# Patient Record
Sex: Female | Born: 1960 | Race: White | Hispanic: No | Smoking: Current every day smoker
Health system: Southern US, Community
[De-identification: ages and names within clinical notes are randomized; demographics above are authoritative.]

## PROBLEM LIST (undated history)

## (undated) DIAGNOSIS — R569 Unspecified convulsions: Secondary | ICD-10-CM

## (undated) HISTORY — PX: ABDOMINAL HYSTERECTOMY: SHX81

---

## 2012-12-20 ENCOUNTER — Emergency Department (HOSPITAL_COMMUNITY)
Admission: EM | Admit: 2012-12-20 | Discharge: 2012-12-20 | Disposition: A | Discharge status: Home / Self Care | Payer: BC Managed Care – PPO | Attending: Emergency Medicine | Admitting: Emergency Medicine

## 2012-12-20 ENCOUNTER — Emergency Department (HOSPITAL_COMMUNITY): Payer: BC Managed Care – PPO

## 2012-12-20 ENCOUNTER — Encounter (HOSPITAL_COMMUNITY): Payer: Self-pay | Admitting: Emergency Medicine

## 2012-12-20 DIAGNOSIS — W1809XA Striking against other object with subsequent fall, initial encounter: Secondary | ICD-10-CM | POA: Insufficient documentation

## 2012-12-20 DIAGNOSIS — S20212A Contusion of left front wall of thorax, initial encounter: Secondary | ICD-10-CM

## 2012-12-20 DIAGNOSIS — R5381 Other malaise: Secondary | ICD-10-CM | POA: Insufficient documentation

## 2012-12-20 DIAGNOSIS — R197 Diarrhea, unspecified: Secondary | ICD-10-CM | POA: Insufficient documentation

## 2012-12-20 DIAGNOSIS — K529 Noninfective gastroenteritis and colitis, unspecified: Secondary | ICD-10-CM

## 2012-12-20 DIAGNOSIS — Z79899 Other long term (current) drug therapy: Secondary | ICD-10-CM | POA: Insufficient documentation

## 2012-12-20 DIAGNOSIS — Y9289 Other specified places as the place of occurrence of the external cause: Secondary | ICD-10-CM | POA: Insufficient documentation

## 2012-12-20 DIAGNOSIS — S0990XA Unspecified injury of head, initial encounter: Secondary | ICD-10-CM | POA: Insufficient documentation

## 2012-12-20 DIAGNOSIS — S20219A Contusion of unspecified front wall of thorax, initial encounter: Secondary | ICD-10-CM | POA: Insufficient documentation

## 2012-12-20 DIAGNOSIS — Z9889 Other specified postprocedural states: Secondary | ICD-10-CM | POA: Insufficient documentation

## 2012-12-20 DIAGNOSIS — R112 Nausea with vomiting, unspecified: Secondary | ICD-10-CM | POA: Insufficient documentation

## 2012-12-20 DIAGNOSIS — Z90710 Acquired absence of both cervix and uterus: Secondary | ICD-10-CM | POA: Insufficient documentation

## 2012-12-20 DIAGNOSIS — G40909 Epilepsy, unspecified, not intractable, without status epilepticus: Secondary | ICD-10-CM | POA: Insufficient documentation

## 2012-12-20 DIAGNOSIS — W010XXA Fall on same level from slipping, tripping and stumbling without subsequent striking against object, initial encounter: Secondary | ICD-10-CM | POA: Insufficient documentation

## 2012-12-20 DIAGNOSIS — F17200 Nicotine dependence, unspecified, uncomplicated: Secondary | ICD-10-CM | POA: Insufficient documentation

## 2012-12-20 DIAGNOSIS — Y93E1 Activity, personal bathing and showering: Secondary | ICD-10-CM | POA: Insufficient documentation

## 2012-12-20 DIAGNOSIS — K5289 Other specified noninfective gastroenteritis and colitis: Secondary | ICD-10-CM | POA: Insufficient documentation

## 2012-12-20 HISTORY — DX: Unspecified convulsions: R56.9

## 2012-12-20 LAB — CBC WITH DIFFERENTIAL/PLATELET
Basophils Relative: 0 % (ref 0–1)
HCT: 45 % (ref 36.0–46.0)
Hemoglobin: 15.2 g/dL — ABNORMAL HIGH (ref 12.0–15.0)
Lymphocytes Relative: 4 % — ABNORMAL LOW (ref 12–46)
Lymphs Abs: 0.5 10*3/uL — ABNORMAL LOW (ref 0.7–4.0)
Monocytes Absolute: 0.6 10*3/uL (ref 0.1–1.0)
Monocytes Relative: 4 % (ref 3–12)
Neutro Abs: 13.4 10*3/uL — ABNORMAL HIGH (ref 1.7–7.7)
Neutrophils Relative %: 92 % — ABNORMAL HIGH (ref 43–77)
RBC: 5.05 MIL/uL (ref 3.87–5.11)
WBC: 14.6 10*3/uL — ABNORMAL HIGH (ref 4.0–10.5)

## 2012-12-20 LAB — BASIC METABOLIC PANEL
BUN: 13 mg/dL (ref 6–23)
CO2: 27 mEq/L (ref 19–32)
Chloride: 98 mEq/L (ref 96–112)
Creatinine, Ser: 0.63 mg/dL (ref 0.50–1.10)
GFR calc Af Amer: >90 mL/min (ref >90)
Potassium: 4.1 mEq/L (ref 3.5–5.1)

## 2012-12-20 LAB — URINALYSIS, ROUTINE W REFLEX MICROSCOPIC
Glucose, UA: NEGATIVE mg/dL
Ketones, ur: 40 mg/dL — AB
Leukocytes, UA: NEGATIVE
Nitrite: NEGATIVE
Specific Gravity, Urine: 1.027 (ref 1.005–1.030)
pH: 6.5 (ref 5.0–8.0)

## 2012-12-20 MED ORDER — HYDROCODONE-ACETAMINOPHEN 5-325 MG PO TABS: 2.0000 | ORAL_TABLET | ORAL | Status: DC | PRN

## 2012-12-20 MED ORDER — IBUPROFEN 800 MG PO TABS: 800.0000 mg | ORAL_TABLET | Freq: Three times a day (TID) | ORAL | Status: DC

## 2012-12-20 MED ORDER — SODIUM CHLORIDE 0.9 % IV BOLUS (SEPSIS)
1000.0000 mL | Freq: Once | INTRAVENOUS | Status: AC
  Administered 2012-12-20: 1000 mL via INTRAVENOUS

## 2012-12-20 MED ORDER — ONDANSETRON HCL 4 MG/2ML IJ SOLN: 4.0000 mg | Freq: Once | INTRAMUSCULAR | Status: DC

## 2012-12-20 MED ORDER — ONDANSETRON HCL 4 MG PO TABS: 4.0000 mg | ORAL_TABLET | Freq: Four times a day (QID) | ORAL | Status: DC

## 2012-12-20 NOTE — ED Notes (Signed)
Pt ambulated in the hallway stating " I'm just dizzy cause I just woke up."

## 2012-12-20 NOTE — ED Notes (Signed)
Pt states she was vomiting this morning and ran to the restroom and tripped and fell hitting her head on the wall and landed on the side of the bathtub hitting the left side of her ribs  Pt also c/o pain to her right hand

## 2012-12-20 NOTE — ED Notes (Signed)
Pt stating "fluids are making me nauseous"

## 2012-12-20 NOTE — ED Notes (Signed)
Pt aware of the need for a urine sample. 

## 2012-12-20 NOTE — ED Provider Notes (Signed)
History     CSN: [id number]  Arrival date & time 12/20/12  0620   First MD Initiated Contact with Patient 12/20/12 0700      Chief Complaint  Patient presents with  . Fall  . Rib Injury    (Consider location/radiation/quality/duration/timing/severity/associated sxs/prior treatment) HPI Comments: Patient presents with head, R hand, and L rib pain after trip and fall in the bathroom this morning.  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  No fever.  Denies dizziness, lightheadedness, SOB.  Pain in L ribs with inspiration. Denies LOC.  Hit head on tub and ribs on tub as well.  No history of blood thinner use.  The history is provided by the patient.    Past Medical History  Diagnosis Date  . Seizures     Past Surgical History  Procedure Laterality Date  . Cesarean section    . Abdominal hysterectomy      Family History  Problem Relation Age of Onset  . Cancer Father   . Cancer Brother     History  Substance Use Topics  . Smoking status: Current Every Day Smoker -- 0.50 packs/day    Types: Cigarettes  . Smokeless tobacco: Not on file  . Alcohol Use: No    OB History   Grav Para Term Preterm Abortions TAB SAB Ect Mult Living                  Review of Systems  Constitutional: Negative for fever and activity change.  HENT: Negative for congestion and rhinorrhea.   Respiratory: Negative for cough and shortness of breath.   Cardiovascular: Positive for chest pain.  Gastrointestinal: Positive for nausea, vomiting and diarrhea. Negative for abdominal pain.  Genitourinary: Negative for dysuria, vaginal bleeding and vaginal discharge.  Musculoskeletal: Positive for myalgias and arthralgias. Negative for back pain.  Skin: Negative for rash.  Neurological: Positive for weakness and headaches. Negative for dizziness and light-headedness.  A complete 10 system review of systems was obtained  and all systems are negative except as noted in the HPI and PMH.    Allergies  Codeine; Demerol; Dilantin; Penicillins; and Tegretol  Home Medications   Current Outpatient Rx  Name  Route  Sig  Dispense  Refill  . acetaminophen (TYLENOL) 500 MG tablet   Oral   Take 500 mg by mouth every 6 (six) hours as needed for pain.         . calcium-vitamin D (OSCAL WITH D) 500-200 MG-UNIT per tablet   Oral   Take 1 tablet by mouth daily.         . clonazePAM (KLONOPIN) 0.5 MG tablet   Oral   Take 0.5 mg by mouth 4 (four) times daily as needed for anxiety.         . divalproex (DEPAKOTE) 250 MG DR tablet   Oral   Take 250 mg by mouth 3 (three) times daily.         Marland Kitchen estrogens-methylTEST 1.25-2.5 MG TABS per tablet   Oral   Take 1 tablet by mouth daily.         . Multiple Vitamin (MULTIVITAMIN WITH MINERALS) TABS   Oral   Take 1 tablet by mouth 3 (three) times daily.           BP 116/65  Pulse 97  Temp(Src) 98 F (36.7 C) (Oral)  Resp 20  SpO2 100%  Physical  Exam  Constitutional: She is oriented to person, place, and time. She appears well-developed and well-nourished. No distress.  HENT:  Head: Normocephalic and atraumatic.  Mouth/Throat: Oropharynx is clear and moist. No oropharyngeal exudate.  Eyes: Conjunctivae and EOM are normal. Pupils are equal, round, and reactive to light.  Neck: Normal range of motion. Neck supple.  No C spine pain, stepoff, or deformity  Cardiovascular: Normal rate, regular rhythm and normal heart sounds.   No murmur heard. Pulmonary/Chest: Effort normal and breath sounds normal. No respiratory distress.   She exhibits tenderness.    TTP L chest without ecchymosis or crepitance Chaperone present  Abdominal: Soft. There is no tenderness. There is no rebound and no guarding.  Musculoskeletal: Normal range of motion. She exhibits no edema and no tenderness.  Diffuse tenderness to R hand without deformity. +2 radial pulse, cardinal  hand movements intact.  Neurological: She is alert and oriented to person, place, and time. No cranial nerve deficit. She exhibits normal muscle tone. Coordination normal.  Skin: Skin is warm.    ED Course  Procedures (including critical care time)  Labs Reviewed  CBC WITH DIFFERENTIAL - Abnormal; Notable for the following:    WBC 14.6 (*)    Hemoglobin 15.2 (*)    Neutrophils Relative 92 (*)    Neutro Abs 13.4 (*)    Lymphocytes Relative 4 (*)    Lymphs Abs 0.5 (*)    All other components within normal limits  URINALYSIS, ROUTINE W REFLEX MICROSCOPIC - Abnormal; Notable for the following:    Bilirubin Urine SMALL (*)    Ketones, ur 40 (*)    All other components within normal limits  BASIC METABOLIC PANEL   Dg Chest 2 View  12/20/2012  *RADIOLOGY REPORT*  Clinical Data: Fall.  Left lateral rib pain.  Smoker. Shortness of breath.  CHEST - 2 VIEW  Comparison: None.  Findings: No evidence of left-sided rib fracture.  If this remained of high clinical concern, detailed rib films can be obtained for further delineation.  No infiltrate, congestive heart failure or pneumothorax.  Increased lung markings with minimal peribronchial thickening may be related to smoking history.  No plain film evidence of pulmonary malignancy.  Degenerative changes thoracic spine.  Heart size within normal limits.  IMPRESSION: No acute abnormality.  Please see above.   Original Report Authenticated By: Lacy Duverney, M.D.    Dg Hand Complete Right  12/20/2012  *RADIOLOGY REPORT*  Clinical Data: Fall.  Hand pain.  RIGHT HAND - COMPLETE 3+ VIEW  Comparison: None.  Findings: No acute fracture or dislocation.  IMPRESSION: No acute fracture.   Original Report Authenticated By: Lacy Duverney, M.D.      No diagnosis found.    MDM  Mechanical fall this morning, tripped and pants and hit head, right hand and left ribs on the tub. No loss of consciousness. Feeling weak from GI symptoms that started yesterday. No  syncope.  X-rays are negative for fractures. Patient is given IV hydration but refuses antiemetics. Orthostatics are positive. Ketones in urine. Leukocytosis. She's had no vomiting in the ED. Abdomen soft on reassessment. No pain at McBurney's point. No guarding or rebound.  Tolerating PO.  Suspect viral gastroenteritis given sick contacts.  Rib contusion and hand contusion from fall.    Date: 12/20/2012  Rate: 79  Rhythm: normal sinus rhythm  QRS Axis: normal  Intervals: normal  ST/T Wave abnormalities: normal  Conduction Disutrbances:none  Narrative Interpretation:   Old EKG Reviewed: none  available    Glynn Octave, MD 12/20/12 [phone]

## 2014-03-28 ENCOUNTER — Ambulatory Visit (INDEPENDENT_AMBULATORY_CARE_PROVIDER_SITE_OTHER): Payer: BC Managed Care – PPO | Admitting: Family Medicine

## 2014-03-28 VITALS — BP 114/68 | HR 75 | Temp 98.3°F | Resp 18 | Ht 63.25 in | Wt 136.5 lb

## 2014-03-28 DIAGNOSIS — J0100 Acute maxillary sinusitis, unspecified: Secondary | ICD-10-CM

## 2014-03-28 MED ORDER — AZITHROMYCIN 250 MG PO TABS: ORAL_TABLET | ORAL | Status: DC

## 2014-03-28 NOTE — Progress Notes (Signed)
The chart was scribed for Crystal SidleKurt Lauenstein, MD, by Yevette EdwardsAngela Bracken, ED Scribe. This patient's care was started at 12:41 PM.  Patient ID: Crystal FowlerMaryfrances Miranda MRN: [id number], DOB: [birth year], 53 y.o. Date of Encounter: 03/28/2014, 12:41 PM  Primary Physician: No primary provider on file.  Chief Complaint:  Chief Complaint  Patient presents with   Headache   Nausea   Sinusitis   Eye Pain   Otalgia     HPI: 53 y.o. year old female with history below presents with bilateral otalgia which began yesterday afternoon and has been associated with a headache, sinus pressure, congestion, fever, and a cough productive of greenish-brown phlegm. She has used Dayquil and IBU to treat the symptoms without resolution. She has not used Afrin. She denies sick contacts.   She states diarrhea as a side effect of Levaquin.   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    Past Medical History  Diagnosis Date   Seizures      Home Meds: Prior to Admission medications   Medication Sig Start Date End Date Taking? Authorizing Provider  acetaminophen (TYLENOL) 500 MG tablet Take 500 mg by mouth every 6 (six) hours as needed for pain.   Yes Historical Provider, MD  calcium-vitamin D (OSCAL WITH D) 500-200 MG-UNIT per tablet Take 1 tablet by mouth daily.   Yes Historical Provider, MD  clonazePAM (KLONOPIN) 0.5 MG tablet Take 0.5 mg by mouth 4 (four) times daily as needed for anxiety.   Yes Historical Provider, MD  divalproex (DEPAKOTE) 250 MG DR tablet Take 250 mg by mouth 3 (three) times daily.   Yes Historical Provider, MD  Est Estrogens-Methyltest (COVARYX HS PO) Take 1 tablet by mouth daily.   Yes Historical Provider, MD  ibuprofen (ADVIL,MOTRIN) 800 MG tablet Take 1 tablet (800 mg total) by mouth 3 (three) times daily. 12/20/12  Yes Glynn OctaveStephen Rancour, MD  Multiple Vitamin (MULTIVITAMIN WITH MINERALS) TABS Take 1 tablet by mouth 3 (three) times daily.   Yes Historical Provider, MD    Allergies:  Allergies   Allergen Reactions   Codeine    Demerol [Meperidine]    Dilantin [Phenytoin Sodium Extended]    Penicillins    Tegretol [Carbamazepine]     History   Social History   Marital Status: Widowed    Spouse Name: N/A    Number of Children: N/A   Years of Education: N/A   Occupational History   Not on file.   Social History Main Topics   Smoking status: Current Every Day Smoker -- 0.50 packs/day    Types: Cigarettes   Smokeless tobacco: Not on file   Alcohol Use: No   Drug Use: No   Sexual Activity: Not on file   Other Topics Concern   Not on file   Social History Narrative   No narrative on file     Review of Systems: Constitutional: positive fever, negative for chills, night sweats, weight changes, or fatigue  HEENT: positive for otalgia, sinus pressure, and congestion; negative for vision changes, hearing loss, rhinorrhea, or epistaxis,  Cardiovascular: negative for chest pain or palpitations Respiratory: positive for a cough; negative for hemoptysis, wheezing, or shortness of breath Abdominal: negative for abdominal pain, nausea, vomiting, diarrhea, or constipation Dermatological: negative for rash Neurologic: positive for headache; negative for dizziness or syncope All other systems reviewed and are otherwise negative with the exception to those above and in the HPI.   Physical Exam: Triage Vitals: Blood pressure 114/68, pulse 75, temperature 98.3  F (36.8 C), temperature source Oral, resp. rate 18, height 5' 3.25" (1.607 m), weight 136 lb 8 oz (61.916 kg), SpO2 95.00%., Body mass index is 23.98 kg/(m^2).  General: Well developed, well nourished, in no acute distress. Head: Normocephalic, atraumatic, eyes without discharge, sclera non-icteric, nares are without discharge. Bilateral auditory canals clear, TM's are without perforation, pearly grey and translucent with reflective cone of light bilaterally. Mild erythema to posterior oral pharynx. Oral  cavity moist, posterior pharynx without exudate, peritonsillar abscess, or post nasal drip.  Neck: Supple. No thyromegaly. Full ROM. No lymphadenopathy. Lungs: Clear bilaterally to auscultation without wheezes, rales, or rhonchi. Breathing is unlabored. Heart: RRR with S1 S2. No murmurs, rubs, or gallops appreciated. Abdomen: Soft, non-tender, non-distended with normoactive bowel sounds. No hepatomegaly. No rebound/guarding. No obvious abdominal masses. Msk:  Strength and tone normal for age. Extremities/Skin: Warm and dry. No clubbing or cyanosis. No edema. No rashes or suspicious lesions. Neuro: Alert and oriented X 3. Moves all extremities spontaneously. Gait is normal. CNII-XII grossly in tact. Psych:  Responds to questions appropriately with a normal affect.    ASSESSMENT AND PLAN:  53 y.o. year old female with Acute maxillary sinusitis, recurrence not specified - Plan: azithromycin (ZITHROMAX) 250 MG tablet     Signed, Crystal SidleKurt Lauenstein, MD 03/28/2014 12:41 PM

## 2014-03-28 NOTE — Patient Instructions (Signed)
Pick up oxymetazoline nasal spray (eg Afrin) for twice a day use for 2 days only every 12 hours   Sinusitis Sinusitis is redness, soreness, and swelling (inflammation) of the paranasal sinuses. Paranasal sinuses are air pockets within the bones of your face (beneath the eyes, the middle of the forehead, or above the eyes). In healthy paranasal sinuses, mucus is able to drain out, and air is able to circulate through them by way of your nose. However, when your paranasal sinuses are inflamed, mucus and air can become trapped. This can allow bacteria and other germs to grow and cause infection. Sinusitis can develop quickly and last only a short time (acute) or continue over a long period (chronic). Sinusitis that lasts for more than 12 weeks is considered chronic.  CAUSES  Causes of sinusitis include:  Allergies.  Structural abnormalities, such as displacement of the cartilage that separates your nostrils (deviated septum), which can decrease the air flow through your nose and sinuses and affect sinus drainage.  Functional abnormalities, such as when the small hairs (cilia) that line your sinuses and help remove mucus do not work properly or are not present. SYMPTOMS  Symptoms of acute and chronic sinusitis are the same. The primary symptoms are pain and pressure around the affected sinuses. Other symptoms include:  Upper toothache.  Earache.  Headache.  Bad breath.  Decreased sense of smell and taste.  A cough, which worsens when you are lying flat.  Fatigue.  Fever.  Thick drainage from your nose, which often is green and may contain pus (purulent).  Swelling and warmth over the affected sinuses. DIAGNOSIS  Your caregiver will perform a physical exam. During the exam, your caregiver may:  Look in your nose for signs of abnormal growths in your nostrils (nasal polyps).  Tap over the affected sinus to check for signs of infection.  View the inside of your sinuses (endoscopy)  with a special imaging device with a light attached (endoscope), which is inserted into your sinuses. If your caregiver suspects that you have chronic sinusitis, one or more of the following tests may be recommended:  Allergy tests.  Nasal culture--A sample of mucus is taken from your nose and sent to a lab and screened for bacteria.  Nasal cytology--A sample of mucus is taken from your nose and examined by your caregiver to determine if your sinusitis is related to an allergy. TREATMENT  Most cases of acute sinusitis are related to a viral infection and will resolve on their own within 10 days. Sometimes medicines are prescribed to help relieve symptoms (pain medicine, decongestants, nasal steroid sprays, or saline sprays).  However, for sinusitis related to a bacterial infection, your caregiver will prescribe antibiotic medicines. These are medicines that will help kill the bacteria causing the infection.  Rarely, sinusitis is caused by a fungal infection. In theses cases, your caregiver will prescribe antifungal medicine. For some cases of chronic sinusitis, surgery is needed. Generally, these are cases in which sinusitis recurs more than 3 times per year, despite other treatments. HOME CARE INSTRUCTIONS   Drink plenty of water. Water helps thin the mucus so your sinuses can drain more easily.  Use a humidifier.  Inhale steam 3 to 4 times a day (for example, sit in the bathroom with the shower running).  Apply a warm, moist washcloth to your face 3 to 4 times a day, or as directed by your caregiver.  Use saline nasal sprays to help moisten and clean your sinuses.  Take over-the-counter or prescription medicines for pain, discomfort, or fever only as directed by your caregiver. SEEK IMMEDIATE MEDICAL CARE IF:  You have increasing pain or severe headaches.  You have nausea, vomiting, or drowsiness.  You have swelling around your face.  You have vision problems.  You have a stiff  neck.  You have difficulty breathing. MAKE SURE YOU:   Understand these instructions.  Will watch your condition.  Will get help right away if you are not doing well or get worse. Document Released: 09/18/2005 Document Revised: 12/11/2011 Document Reviewed: 10/03/2011 [facility] Patient Information 2015 [employer], [place]. This information is not intended to replace advice given to you by your health care provider. Make sure you discuss any questions you have with your health care provider.

## 2015-05-13 ENCOUNTER — Ambulatory Visit (INDEPENDENT_AMBULATORY_CARE_PROVIDER_SITE_OTHER): Payer: BLUE CROSS/BLUE SHIELD | Admitting: Urgent Care

## 2015-05-13 VITALS — BP 128/72 | HR 75 | Temp 98.2°F | Resp 15 | Ht 63.25 in | Wt 142.0 lb

## 2015-05-13 DIAGNOSIS — J069 Acute upper respiratory infection, unspecified: Secondary | ICD-10-CM | POA: Insufficient documentation

## 2015-05-13 DIAGNOSIS — R05 Cough: Secondary | ICD-10-CM | POA: Diagnosis not present

## 2015-05-13 DIAGNOSIS — J029 Acute pharyngitis, unspecified: Secondary | ICD-10-CM | POA: Diagnosis not present

## 2015-05-13 DIAGNOSIS — R059 Cough, unspecified: Secondary | ICD-10-CM

## 2015-05-13 DIAGNOSIS — J302 Other seasonal allergic rhinitis: Secondary | ICD-10-CM | POA: Diagnosis not present

## 2015-05-13 DIAGNOSIS — R0981 Nasal congestion: Secondary | ICD-10-CM

## 2015-05-13 DIAGNOSIS — H6990 Unspecified Eustachian tube disorder, unspecified ear: Secondary | ICD-10-CM | POA: Insufficient documentation

## 2015-05-13 DIAGNOSIS — H6982 Other specified disorders of Eustachian tube, left ear: Secondary | ICD-10-CM | POA: Diagnosis not present

## 2015-05-13 DIAGNOSIS — H6980 Other specified disorders of Eustachian tube, unspecified ear: Secondary | ICD-10-CM | POA: Insufficient documentation

## 2015-05-13 MED ORDER — CETIRIZINE HCL 10 MG PO TABS: 10.0000 mg | ORAL_TABLET | Freq: Every day | ORAL | Status: DC

## 2015-05-13 MED ORDER — HYDROCODONE-HOMATROPINE 5-1.5 MG/5ML PO SYRP: 5.0000 mL | ORAL_SOLUTION | Freq: Every evening | ORAL | Status: DC | PRN

## 2015-05-13 MED ORDER — PSEUDOEPHEDRINE HCL ER 120 MG PO TB12
120.0000 mg | ORAL_TABLET | Freq: Two times a day (BID) | ORAL | Status: DC
Start: 2015-05-13 — End: 2022-05-09

## 2015-05-13 NOTE — Progress Notes (Signed)
    MRN: [id number] DOB: August 16, 1961  Subjective:   Crystal Miranda is a 54 y.o. female presenting for chief complaint of Nasal Congestion; Ear Fullness; Cough; and Fever  Reports 2 day history of nasal congestion, rhinorrhea, left ear fullness and pain, sore throat, chest tightness, reports fever (highest ~100F). Has tried DayQuil, NyQuil with minimal relief. Patient is a Patent examiner. Denies itchy or red eyes, sinus pain, ear drainage, chest pain, wheezing, n/v, abdominal pain. Patient is a smoker, 1/2ppd. Admits history of seasonal allergies. Denies any other aggravating or relieving factors, no other questions or concerns.  Crystal Miranda has a current medication list which includes the following prescription(s): acetaminophen, calcium-vitamin d, clonazepam, divalproex, est estrogens-methyltest, ibuprofen, and multivitamin with minerals. Also is allergic to codeine; demerol; dilantin; penicillins; and tegretol.  Crystal Miranda  has a past medical history of Seizures. Also  has past surgical history that includes Cesarean section and Abdominal hysterectomy.  Objective:   Vitals: BP 128/72 mmHg  Pulse 75  Temp(Src) 98.2 F (36.8 C) (Oral)  Resp 15  Ht 5' 3.25" (1.607 m)  Wt 142 lb (64.411 kg)  BMI 24.94 kg/m2  SpO2 99%  Physical Exam  Constitutional: She is oriented to person, place, and time. She appears well-developed and well-nourished.  HENT:  Left TM with small effusion, otherwise TM's intact bilaterally, no erythema. Nasal turbinates pink and moist, nasal passages patent, no sinus tenderness. Significant postnasal drip present, without oropharyngeal exudates, erythema or abscesses.   Eyes: Conjunctivae are normal. No scleral icterus.  Cardiovascular: Normal rate, regular rhythm and intact distal pulses.  Exam reveals no gallop and no friction rub.   No murmur heard. Pulmonary/Chest: No stridor. No respiratory distress. She has no wheezes. She has no rales.    Musculoskeletal: She exhibits no edema.  Lymphadenopathy:    She has no cervical adenopathy.  Neurological: She is alert and oriented to person, place, and time.  Skin: Skin is warm and dry. No rash noted. No erythema. No pallor.    Assessment and Plan :   1. Seasonal allergies - Start oral antihistamine, consider nasal steroid. - cetirizine (ZYRTEC) 10 MG tablet; Take 1 tablet (10 mg total) by mouth daily.  Dispense: 30 tablet; Refill: 11  2. URI (upper respiratory infection) 3. Nasal congestion 4. Eustachian tube dysfunction, left 5. Sore throat 6. Cough - Likely undergoing viral syndrome, offered supportive care - If no improvement call clinic in 5 days   Wallis Bamberg, PA-C [facility] [phone] 05/13/2015 11:59 AM

## 2015-05-13 NOTE — Patient Instructions (Signed)
Upper Respiratory Infection, Adult °An upper respiratory infection (URI) is also sometimes known as the common cold. The upper respiratory tract includes the nose, sinuses, throat, trachea, and bronchi. Bronchi are the airways leading to the lungs. Most people improve within 1 week, but symptoms can last up to 2 weeks. A residual cough may last even longer.  °CAUSES °Many different viruses can infect the tissues lining the upper respiratory tract. The tissues become irritated and inflamed and often become very moist. Mucus production is also common. A cold is contagious. You can easily spread the virus to others by oral contact. This includes kissing, sharing a glass, coughing, or sneezing. Touching your mouth or nose and then touching a surface, which is then touched by another person, can also spread the virus. °SYMPTOMS  °Symptoms typically develop 1 to 3 days after you come in contact with a cold virus. Symptoms vary from person to person. They may include: °· Runny nose. °· Sneezing. °· Nasal congestion. °· Sinus irritation. °· Sore throat. °· Loss of voice (laryngitis). °· Cough. °· Fatigue. °· Muscle aches. °· Loss of appetite. °· Headache. °· Low-grade fever. °DIAGNOSIS  °You might diagnose your own cold based on familiar symptoms, since most people get a cold 2 to 3 times a year. Your caregiver can confirm this based on your exam. Most importantly, your caregiver can check that your symptoms are not due to another disease such as strep throat, sinusitis, pneumonia, asthma, or epiglottitis. Blood tests, throat tests, and X-rays are not necessary to diagnose a common cold, but they may sometimes be helpful in excluding other more serious diseases. Your caregiver will decide if any further tests are required. °RISKS AND COMPLICATIONS  °You may be at risk for a more severe case of the common cold if you smoke cigarettes, have chronic heart disease (such as heart failure) or lung disease (such as asthma), or if  you have a weakened immune system. The very young and very old are also at risk for more serious infections. Bacterial sinusitis, middle ear infections, and bacterial pneumonia can complicate the common cold. The common cold can worsen asthma and chronic obstructive pulmonary disease (COPD). Sometimes, these complications can require emergency medical care and may be life-threatening. °PREVENTION  °The best way to protect against getting a cold is to practice good hygiene. Avoid oral or hand contact with people with cold symptoms. Wash your hands often if contact occurs. There is no clear evidence that vitamin C, vitamin E, echinacea, or exercise reduces the chance of developing a cold. However, it is always recommended to get plenty of rest and practice good nutrition. °TREATMENT  °Treatment is directed at relieving symptoms. There is no cure. Antibiotics are not effective, because the infection is caused by a virus, not by bacteria. Treatment may include: °· Increased fluid intake. Sports drinks offer valuable electrolytes, sugars, and fluids. °· Breathing heated mist or steam (vaporizer or shower). °· Eating chicken soup or other clear broths, and maintaining good nutrition. °· Getting plenty of rest. °· Using gargles or lozenges for comfort. °· Controlling fevers with ibuprofen or acetaminophen as directed by your caregiver. °· Increasing usage of your inhaler if you have asthma. °Zinc gel and zinc lozenges, taken in the first 24 hours of the common cold, can shorten the duration and lessen the severity of symptoms. Pain medicines may help with fever, muscle aches, and throat pain. A variety of non-prescription medicines are available to treat congestion and runny nose. Your caregiver   can make recommendations and may suggest nasal or lung inhalers for other symptoms.  °HOME CARE INSTRUCTIONS  °· Only take over-the-counter or prescription medicines for pain, discomfort, or fever as directed by your  caregiver. °· Use a warm mist humidifier or inhale steam from a shower to increase air moisture. This may keep secretions moist and make it easier to breathe. °· Drink enough water and fluids to keep your urine clear or pale yellow. °· Rest as needed. °· Return to work when your temperature has returned to normal or as your caregiver advises. You may need to stay home longer to avoid infecting others. You can also use a face mask and careful hand washing to prevent spread of the virus. °SEEK MEDICAL CARE IF:  °· After the first few days, you feel you are getting worse rather than better. °· You need your caregiver's advice about medicines to control symptoms. °· You develop chills, worsening shortness of breath, or brown or red sputum. These may be signs of pneumonia. °· You develop yellow or brown nasal discharge or pain in the face, especially when you bend forward. These may be signs of sinusitis. °· You develop a fever, swollen neck glands, pain with swallowing, or white areas in the back of your throat. These may be signs of strep throat. °SEEK IMMEDIATE MEDICAL CARE IF:  °· You have a fever. °· You develop severe or persistent headache, ear pain, sinus pain, or chest pain. °· You develop wheezing, a prolonged cough, cough up blood, or have a change in your usual mucus (if you have chronic lung disease). °· You develop sore muscles or a stiff neck. °Document Released: 03/14/2001 Document Revised: 12/11/2011 Document Reviewed: 12/24/2013 °ExitCare® Patient Information ©2015 [employer]. This information is not intended to replace advice given to you by your health care provider. Make sure you discuss any questions you have with your health care provider. °Barotitis Media °Barotitis media is inflammation of your middle ear. This occurs when the auditory tube (eustachian tube) leading from the back of your nose (nasopharynx) to your eardrum is blocked. This blockage may result from a cold, environmental  allergies, or an upper respiratory infection. Unresolved barotitis media may lead to damage or hearing loss (barotrauma), which may become permanent. °HOME CARE INSTRUCTIONS  °· Use medicines as recommended by your health care provider. Over-the-counter medicines will help unblock the canal and can help during times of air travel. °· Do not put anything into your ears to clean or unplug them. Eardrops will not be helpful. °· Do not swim, dive, or fly until your health care provider says it is all right to do so. If these activities are necessary, chewing gum with frequent, forceful swallowing may help. It is also helpful to hold your nose and gently blow to pop your ears for equalizing pressure changes. This forces air into the eustachian tube. °· Only take over-the-counter or prescription medicines for pain, discomfort, or fever as directed by your health care provider. °· A decongestant may be helpful in decongesting the middle ear and make pressure equalization easier. °SEEK MEDICAL CARE IF: °· You experience a serious form of dizziness in which you feel as if the room is spinning and you feel nauseated (vertigo). °· Your symptoms only involve one ear. °SEEK IMMEDIATE MEDICAL CARE IF:  °· You develop a severe headache, dizziness, or severe ear pain. °· You have bloody or pus-like drainage from your ears. °· You develop a fever. °· Your problems do   not improve or become worse. °MAKE SURE YOU:  °· Understand these instructions. °· Will watch your condition. °· Will get help right away if you are not doing well or get worse. °Document Released: 09/15/2000 Document Revised: 07/09/2013 Document Reviewed: 04/15/2013 °ExitCare® Patient Information ©2015 [employer]. This information is not intended to replace advice given to you by your health care provider. Make sure you discuss any questions you have with your health care provider. ° °

## 2019-11-27 ENCOUNTER — Encounter (HOSPITAL_COMMUNITY): Payer: Self-pay | Admitting: Emergency Medicine

## 2019-11-27 ENCOUNTER — Emergency Department (HOSPITAL_COMMUNITY): Payer: PRIVATE HEALTH INSURANCE

## 2019-11-27 ENCOUNTER — Emergency Department (HOSPITAL_COMMUNITY)
Admission: EM | Admit: 2019-11-27 | Discharge: 2019-11-27 | Disposition: A | Discharge status: Home / Self Care | Payer: PRIVATE HEALTH INSURANCE | Attending: Emergency Medicine | Admitting: Emergency Medicine

## 2019-11-27 DIAGNOSIS — Y999 Unspecified external cause status: Secondary | ICD-10-CM | POA: Diagnosis not present

## 2019-11-27 DIAGNOSIS — Y9389 Activity, other specified: Secondary | ICD-10-CM | POA: Diagnosis not present

## 2019-11-27 DIAGNOSIS — Y92410 Unspecified street and highway as the place of occurrence of the external cause: Secondary | ICD-10-CM | POA: Insufficient documentation

## 2019-11-27 DIAGNOSIS — F17210 Nicotine dependence, cigarettes, uncomplicated: Secondary | ICD-10-CM | POA: Diagnosis not present

## 2019-11-27 DIAGNOSIS — S199XXA Unspecified injury of neck, initial encounter: Secondary | ICD-10-CM | POA: Insufficient documentation

## 2019-11-27 DIAGNOSIS — S161XXA Strain of muscle, fascia and tendon at neck level, initial encounter: Secondary | ICD-10-CM | POA: Diagnosis not present

## 2019-11-27 DIAGNOSIS — T148XXA Other injury of unspecified body region, initial encounter: Secondary | ICD-10-CM

## 2019-11-27 DIAGNOSIS — S29012A Strain of muscle and tendon of back wall of thorax, initial encounter: Secondary | ICD-10-CM | POA: Diagnosis not present

## 2019-11-27 DIAGNOSIS — M549 Dorsalgia, unspecified: Secondary | ICD-10-CM

## 2019-11-27 DIAGNOSIS — S39012A Strain of muscle, fascia and tendon of lower back, initial encounter: Secondary | ICD-10-CM | POA: Insufficient documentation

## 2019-11-27 DIAGNOSIS — S3992XA Unspecified injury of lower back, initial encounter: Secondary | ICD-10-CM | POA: Diagnosis not present

## 2019-11-27 DIAGNOSIS — S299XXA Unspecified injury of thorax, initial encounter: Secondary | ICD-10-CM | POA: Diagnosis present

## 2019-11-27 MED ORDER — ACETAMINOPHEN 325 MG PO TABS
650.0000 mg | ORAL_TABLET | Freq: Once | ORAL | Status: AC
  Administered 2019-11-27: 16:00:00 650 mg via ORAL
  Filled 2019-11-27: qty 2

## 2019-11-27 MED ORDER — CYCLOBENZAPRINE HCL 5 MG PO TABS: 5.0000 mg | ORAL_TABLET | Freq: Two times a day (BID) | ORAL | 0 refills | Status: DC | PRN

## 2019-11-27 MED ORDER — CYCLOBENZAPRINE HCL 10 MG PO TABS
10.0000 mg | ORAL_TABLET | Freq: Once | ORAL | Status: DC
  Filled 2019-11-27: qty 1
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
## 2019-11-27 NOTE — ED Notes (Signed)
Per Dr Stevie Kern, patient is ok to take her home Clonazepam 0.5mg .

## 2019-11-27 NOTE — ED Notes (Signed)
Patient transported to XR. 
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
## 2019-11-27 NOTE — Discharge Instructions (Addendum)
Recommend Tylenol Motrin for pain control.  These can be combined with your regular medicines.  Return to ER if you develop worsening neck pain, any numbness, weakness, chest pain, difficulty breathing, or other new concerning symptoms.

## 2020-02-28 ENCOUNTER — Ambulatory Visit: Payer: Self-pay

## 2021-05-11 ENCOUNTER — Inpatient Hospital Stay
Admission: RE | Admit: 2021-05-11 | Discharge: 2021-05-11 | Disposition: A | Discharge status: Home / Self Care | Payer: Self-pay | Source: Ambulatory Visit

## 2021-05-11 ENCOUNTER — Ambulatory Visit
Admission: EM | Admit: 2021-05-11 | Discharge: 2021-05-11 | Disposition: A | Discharge status: Home / Self Care | Payer: No Typology Code available for payment source | Attending: Emergency Medicine | Admitting: Emergency Medicine

## 2021-05-11 ENCOUNTER — Encounter: Payer: Self-pay | Admitting: Emergency Medicine

## 2021-05-11 ENCOUNTER — Other Ambulatory Visit: Payer: Self-pay

## 2021-05-11 DIAGNOSIS — R3129 Other microscopic hematuria: Secondary | ICD-10-CM | POA: Diagnosis present

## 2021-05-11 DIAGNOSIS — R109 Unspecified abdominal pain: Secondary | ICD-10-CM | POA: Diagnosis not present

## 2021-05-11 LAB — POCT URINALYSIS DIP (MANUAL ENTRY)
Bilirubin, UA: NEGATIVE
Glucose, UA: NEGATIVE mg/dL
Ketones, POC UA: NEGATIVE mg/dL
Leukocytes, UA: NEGATIVE
Nitrite, UA: NEGATIVE
Protein Ur, POC: NEGATIVE mg/dL
Spec Grav, UA: <=1.005 — AB (ref 1.010–1.025)
Urobilinogen, UA: 0.2 E.U./dL
pH, UA: 5.5 (ref 5.0–8.0)

## 2021-05-11 MED ORDER — CIPROFLOXACIN HCL 500 MG PO TABS: 500.0000 mg | ORAL_TABLET | Freq: Two times a day (BID) | ORAL | 0 refills | Status: AC

## 2021-05-11 MED ORDER — NAPROXEN 500 MG PO TABS: 500.0000 mg | ORAL_TABLET | Freq: Two times a day (BID) | ORAL | 0 refills | Status: DC

## 2021-05-11 MED ORDER — TAMSULOSIN HCL 0.4 MG PO CAPS: 0.4000 mg | ORAL_CAPSULE | Freq: Every day | ORAL | 0 refills | Status: DC

## 2021-05-11 NOTE — ED Triage Notes (Signed)
Patient c/o LFT sided flank pain and dysuria x 2 days.   Patient endorses a temperature of 100.2 F upon onset of symptoms.   Patient endorses ABD pressure.   Patient endorses nausea. Patient endorses increased urinary frequency.   Patient has tried cranberry juice and water with no relief of symptoms. Patient took Tylenol with relief of fever.

## 2021-05-11 NOTE — Discharge Instructions (Addendum)
Please strain urine for possible stone Urine culture pending to further evaluate for infection Begin Cipro twice daily x1 week Flomax daily to help pass any underlying stone Continue Tylenol and ibuprofen Zofran as needed for nausea Drink plenty of water and fluids Follow-up if not improving or worsening

## 2021-05-11 NOTE — ED Provider Notes (Signed)
[facility]    CSN: [id number] Arrival date & time: 05/11/21  1146      History   Chief Complaint Chief Complaint  Patient presents with   Flank Pain   Dysuria    HPI Crystal Miranda is a 60 y.o. female presenting today for evaluation of flank pain.  Reports symptoms began approximately 2 to 3 days ago after work.  Has felt a sensation of a softball being in her left lower back/side.  Has developed associated dysuria and urinary frequency.  Reports fevers up to 102.  She reports running her urine at work yesterday and noted dipstick positive for nitrites, protein, pH off.  Reports negative leuks yesterday.  Has been trying to hydrate and drink plenty of fluids over the past 24 hours.  Denies history of stones.  Bowels at baseline.  HPI  Past Medical History:  Diagnosis Date   Seizures [facility])     Patient Active Problem List   Diagnosis Date Noted   Eustachian tube dysfunction 05/13/2015   URI (upper respiratory infection) 05/13/2015    Past Surgical History:  Procedure Laterality Date   ABDOMINAL HYSTERECTOMY     CESAREAN SECTION      OB History   No obstetric history on file.      Home Medications    Prior to Admission medications   Medication Sig Start Date End Date Taking? Authorizing Provider  ciprofloxacin (CIPRO) 500 MG tablet Take 1 tablet (500 mg total) by mouth every 12 (twelve) hours for 7 days. 05/11/21 05/18/21 Yes Johaan Ryser C, PA-C  conjugated estrogens (PREMARIN) vaginal cream Premarin 0.625 mg/gram vaginal cream  Insert 0.5 applicatorsful twice a week by vaginal route.   Yes [provider]  divalproex (DEPAKOTE) 250 MG DR tablet Take 250 mg by mouth 3 (three) times daily.   Yes [provider]  naproxen (NAPROSYN) 500 MG tablet Take 1 tablet (500 mg total) by mouth 2 (two) times daily. 05/11/21  Yes Taeshawn Helfman C, PA-C  tamsulosin (FLOMAX) 0.4 MG CAPS capsule Take 1 capsule (0.4 mg total) by mouth daily. 05/11/21   Yes Argelia Formisano C, PA-C  acetaminophen (TYLENOL) 500 MG tablet Take 500 mg by mouth every 6 (six) hours as needed for pain.    [provider]  calcium-vitamin D (OSCAL WITH D) 500-200 MG-UNIT per tablet Take 1 tablet by mouth daily.    [provider]  cetirizine (ZYRTEC) 10 MG tablet Take 1 tablet (10 mg total) by mouth daily. 05/13/15   Wallis Bamberg, PA-C  clonazePAM (KLONOPIN) 0.5 MG tablet Take 0.5 mg by mouth 4 (four) times daily as needed for anxiety.    [provider]  cyclobenzaprine (FLEXERIL) 5 MG tablet Take 1 tablet (5 mg total) by mouth 2 (two) times daily as needed for muscle spasms. 11/27/19   Milagros Loll, MD  HYDROcodone-homatropine [facility]) 5-1.5 MG/5ML syrup Take 5 mLs by mouth at bedtime as needed. 05/13/15   Wallis Bamberg, PA-C  ibuprofen (ADVIL,MOTRIN) 800 MG tablet Take 1 tablet (800 mg total) by mouth 3 (three) times daily. 12/20/12   Rancour, Jeannett Senior, MD  Multiple Vitamin (MULTIVITAMIN WITH MINERALS) TABS Take 1 tablet by mouth 3 (three) times daily.    [provider]  pseudoephedrine (SUDAFED 12 HOUR) 120 MG 12 hr tablet Take 1 tablet (120 mg total) by mouth 2 (two) times daily. 05/13/15   Wallis Bamberg, PA-C    Family History Family History  Problem Relation Age of Onset  Cancer Father    Cancer Brother     Social History Social History   Tobacco Use   Smoking status: Every Day    Packs/day: 0.50    Types: Cigarettes   Smokeless tobacco: Never  Substance Use Topics   Alcohol use: No   Drug use: No     Allergies   Codeine, Demerol [meperidine], Dilantin [phenytoin sodium extended], Penicillins, and Tegretol [carbamazepine]   Review of Systems Review of Systems  Constitutional:  Positive for fatigue and fever.  Respiratory:  Negative for shortness of breath.   Cardiovascular:  Negative for chest pain.  Gastrointestinal:  Positive for nausea. Negative for abdominal pain, diarrhea and vomiting.  Genitourinary:   Positive for dysuria, flank pain and frequency. Negative for genital sores, hematuria, menstrual problem, vaginal bleeding, vaginal discharge and vaginal pain.  Musculoskeletal:  Negative for back pain.  Skin:  Negative for rash.  Neurological:  Negative for dizziness, light-headedness and headaches.    Physical Exam Triage Vital Signs ED Triage Vitals  Enc Vitals Group     BP 05/11/21 1202 (!) 144/90     Pulse Rate 05/11/21 1202 (!) 106     Resp 05/11/21 1202 16     Temp 05/11/21 1202 99.3 F (37.4 C)     Temp Source 05/11/21 1202 Oral     SpO2 05/11/21 1202 97 %     Weight --      Height --      Head Circumference --      Peak Flow --      Pain Score 05/11/21 1159 6     Pain Loc --      Pain Edu? --      Excl. in GC? --    No data found.  Updated Vital Signs BP (!) 144/90 (BP Location: Right Arm)   Pulse (!) 106   Temp 99.3 F (37.4 C) (Oral)   Resp 16   SpO2 97%   Visual Acuity Right Eye Distance:   Left Eye Distance:   Bilateral Distance:    Right Eye Near:   Left Eye Near:    Bilateral Near:     Physical Exam Vitals and nursing note reviewed.  Constitutional:      Appearance: She is well-developed.     Comments: No acute distress  HENT:     Head: Normocephalic and atraumatic.     Nose: Nose normal.  Eyes:     Conjunctiva/sclera: Conjunctivae normal.  Cardiovascular:     Rate and Rhythm: Normal rate.  Pulmonary:     Effort: Pulmonary effort is normal. No respiratory distress.  Abdominal:     General: There is no distension.  Musculoskeletal:        General: Normal range of motion.     Cervical back: Neck supple.     Comments: Left lumbar area with tenderness to palpation, no midline tenderness, no overlying rash or discoloration  Skin:    General: Skin is warm and dry.  Neurological:     Mental Status: She is alert and oriented to person, place, and time.     UC Treatments / Results  Labs (all labs ordered are listed, but only abnormal  results are displayed) Labs Reviewed  POCT URINALYSIS DIP (MANUAL ENTRY) - Abnormal; Notable for the following components:      Result Value   Spec Grav, UA <=1.005 (*)    Blood, UA trace-intact (*)    All other components within normal limits  URINE  CULTURE    EKG   Radiology No results found.  Procedures Procedures (including critical care time)  Medications Ordered in UC Medications - No data to display  Initial Impression / Assessment and Plan / UC Course  I have reviewed the triage vital signs and the nursing notes.  Pertinent labs & imaging results that were available during my care of the patient were reviewed by me and considered in my medical decision making (see chart for details).     UA with negative leuks and nitrites, sample is dilute, trace hemoglobin present.  Urine culture pending.  Discussed possibility of underlying stone contributing to pain, proceeding with strainer, Flomax and continued anti-inflammatories as needed.  Given reported fevers as well as sample dilute, did opt to go ahead and initiate on Cipro to cover for possible infection, advised we will stop this antibiotic if culture negative.  Continue to monitor,Discussed strict return precautions. Patient verbalized understanding and is agreeable with plan.  Final Clinical Impressions(s) / UC Diagnoses   Final diagnoses:  Other microscopic hematuria  Flank pain     Discharge Instructions      Please strain urine for possible stone Urine culture pending to further evaluate for infection Begin Cipro twice daily x1 week Flomax daily to help pass any underlying stone Continue Tylenol and ibuprofen Zofran as needed for nausea Drink plenty of water and fluids Follow-up if not improving or worsening     ED Prescriptions     Medication Sig Dispense Auth. Provider   ciprofloxacin (CIPRO) 500 MG tablet Take 1 tablet (500 mg total) by mouth every 12 (twelve) hours for 7 days. 14 tablet Ranette Luckadoo,  Erma Joubert C, PA-C   tamsulosin (FLOMAX) 0.4 MG CAPS capsule Take 1 capsule (0.4 mg total) by mouth daily. 10 capsule Aquarius Tremper C, PA-C   naproxen (NAPROSYN) 500 MG tablet Take 1 tablet (500 mg total) by mouth 2 (two) times daily. 30 tablet Nikeia Henkes, Sheldon C, PA-C      PDMP not reviewed this encounter.   Lunette Tapp, [place] C, PA-C 05/11/21 1300

## 2021-05-12 LAB — URINE CULTURE: Culture: 20000 — AB
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
## 2022-05-08 ENCOUNTER — Ambulatory Visit: Payer: No Typology Code available for payment source | Admitting: Neurology

## 2022-05-09 ENCOUNTER — Encounter: Payer: Self-pay | Admitting: Neurology

## 2022-05-09 ENCOUNTER — Ambulatory Visit: Payer: No Typology Code available for payment source | Admitting: Neurology

## 2022-05-09 VITALS — BP 133/76 | HR 85 | Ht 64.0 in | Wt 148.0 lb

## 2022-05-09 DIAGNOSIS — Z5181 Encounter for therapeutic drug level monitoring: Secondary | ICD-10-CM | POA: Diagnosis not present

## 2022-05-09 DIAGNOSIS — G40909 Epilepsy, unspecified, not intractable, without status epilepticus: Secondary | ICD-10-CM | POA: Diagnosis not present

## 2022-05-09 MED ORDER — PREMARIN 0.625 MG/GM VA CREA: TOPICAL_CREAM | VAGINAL | 0 refills | Status: DC

## 2022-05-09 NOTE — Progress Notes (Signed)
[facility]  PATIENT: Crystal Miranda DOB: [birth year]  REQUESTING CLINICIAN: Guadalupe Maple., MD HISTORY FROM: Patient  REASON FOR VISIT: Establish care for Seizure    HISTORICAL  CHIEF COMPLAINT:  Chief Complaint  Patient presents with   New Patient (Initial Visit)    Rm 12, Abdominal seizures Last seizure was 30 years ago, states she is here to establish care     HISTORY OF PRESENT ILLNESS:  This is a 61 year old woman with history of seizure, history of thyroid goiter and vitamin D deficiency who is presenting to establish care for her "abdominal seizures".  Patient reports a long history of seizure, stated the seizures started in childhood when she was missing time initially and did not know what was going on.  She was not initially diagnosed until she turned 11, during her menses, she will complain of worse abdominal pain associated with nausea and vomiting and then she will go into a generalized convulsion.  She was diagnosed with seizure at that time. Initially, she was put on Dilantin which did not control the seizure and phenobarbital was added.  She reports with those two medications, she was very somnolent and drowsy, therefore she had to be taken out of school and home schooled initially. She also reported being on Tegretol during her pregnancy but later developed severe rash, and last antiseizure medication that she has been on are Depakote and Klonopin for the past 35 years.  Currently she is on Depakote 250 mg 3 times daily and Klonopin 0.5 mg 3 times daily, but she reported taking the Klonopin differently, 0.25 mg in the morning, 0.25 mg in the afternoon and 0.5 mg at night.  However in the past year, her drug screen has been negative for benzodiazepine.  Patient reports that when she takes to generic Klonopin/Clonazepam, urine drug test will be negative.  She reports she has been seizure-free on the combination of Depakote and Klonopin for the past 35  years.   OTHER MEDICAL CONDITIONS: History of seizures, history of goiter, Vitamin D deficiency    REVIEW OF SYSTEMS: Full 14 system review of systems performed and negative with exception of: as noted in the HPI   ALLERGIES: Allergies  Allergen Reactions   Codeine    Demerol [Meperidine]    Dilantin [Phenytoin Sodium Extended]    Penicillins    Tegretol [Carbamazepine]     HOME MEDICATIONS: Outpatient Medications Prior to Visit  Medication Sig Dispense Refill   acetaminophen (TYLENOL) 500 MG tablet Take 500 mg by mouth every 6 (six) hours as needed for pain.     calcium-vitamin D (OSCAL WITH D) 500-200 MG-UNIT per tablet Take 1 tablet by mouth daily.     cetirizine (ZYRTEC) 10 MG tablet Take 1 tablet (10 mg total) by mouth daily. 30 tablet 11   clonazePAM (KLONOPIN) 0.5 MG tablet Take 0.25 mg by mouth in the morning and at bedtime.     cyclobenzaprine (FLEXERIL) 5 MG tablet Take 1 tablet (5 mg total) by mouth 2 (two) times daily as needed for muscle spasms. 20 tablet 0   divalproex (DEPAKOTE) 250 MG DR tablet Take 250 mg by mouth 3 (three) times daily.     ibuprofen (ADVIL,MOTRIN) 800 MG tablet Take 1 tablet (800 mg total) by mouth 3 (three) times daily. 21 tablet 0   Multiple Vitamin (MULTIVITAMIN WITH MINERALS) TABS Take 1 tablet by mouth 3 (three) times daily.     naproxen (NAPROSYN) 500 MG tablet Take  1 tablet (500 mg total) by mouth 2 (two) times daily. 30 tablet 0   conjugated estrogens (PREMARIN) vaginal cream Premarin 0.625 mg/gram vaginal cream  Insert 0.5 applicatorsful twice a week by vaginal route.     HYDROcodone-homatropine (HYCODAN) 5-1.5 MG/5ML syrup Take 5 mLs by mouth at bedtime as needed. 120 mL 0   pseudoephedrine (SUDAFED 12 HOUR) 120 MG 12 hr tablet Take 1 tablet (120 mg total) by mouth 2 (two) times daily. 30 tablet 3   tamsulosin (FLOMAX) 0.4 MG CAPS capsule Take 1 capsule (0.4 mg total) by mouth daily. 10 capsule 0   No facility-administered medications  prior to visit.    PAST MEDICAL HISTORY: Past Medical History:  Diagnosis Date   Seizures (HCC)     PAST SURGICAL HISTORY: Past Surgical History:  Procedure Laterality Date   ABDOMINAL HYSTERECTOMY     CESAREAN SECTION      FAMILY HISTORY: Family History  Problem Relation Age of Onset   Cancer Father    Cancer Brother     SOCIAL HISTORY: Social History   Socioeconomic History   Marital status: Widowed    Spouse name: Not on file   Number of children: Not on file   Years of education: Not on file   Highest education level: Not on file  Occupational History   Not on file  Tobacco Use   Smoking status: Every Day    Packs/day: 0.50    Types: Cigarettes   Smokeless tobacco: Never  Substance and Sexual Activity   Alcohol use: No   Drug use: No   Sexual activity: Not on file  Other Topics Concern   Not on file  Social History Narrative   Not on file   Social Determinants of Health   Financial Resource Strain: Not on file  Food Insecurity: Not on file  Transportation Needs: Not on file  Physical Activity: Not on file  Stress: Not on file  Social Connections: Not on file  Intimate Partner Violence: Not on file    PHYSICAL EXAM  GENERAL EXAM/CONSTITUTIONAL: Vitals:  Vitals:   05/09/22 0941  BP: 133/76  Pulse: 85  Weight: 148 lb (67.1 kg)  Height: 5\' 4"  (1.626 m)   Body mass index is 25.4 kg/m. Wt Readings from Last 3 Encounters:  05/09/22 148 lb (67.1 kg)  05/13/15 142 lb (64.4 kg)  03/28/14 136 lb 8 oz (61.9 kg)   Patient is in no distress; well developed, nourished and groomed; neck is supple  EYES: Pupils round and reactive to light, Visual fields full to confrontation, Extraocular movements intacts,   MUSCULOSKELETAL: Gait, strength, tone, movements noted in Neurologic exam below  NEUROLOGIC: MENTAL STATUS:      No data to display         awake, alert, oriented to person, place and time recent and remote memory intact normal  attention and concentration language fluent, comprehension intact, naming intact fund of knowledge appropriate  CRANIAL NERVE:  2nd, 3rd, 4th, 6th - pupils equal and reactive to light, visual fields full to confrontation, extraocular muscles intact, no nystagmus 5th - facial sensation symmetric 7th - facial strength symmetric 8th - hearing intact 9th - palate elevates symmetrically, uvula midline 11th - shoulder shrug symmetric 12th - tongue protrusion midline  MOTOR:  normal bulk and tone, full strength in the BUE, BLE  SENSORY:  normal and symmetric to light touch, pinprick, temperature, vibration  COORDINATION:  finger-nose-finger, fine finger movements normal  REFLEXES:  deep tendon  reflexes present and symmetric  GAIT/STATION:  normal   DIAGNOSTIC DATA (LABS, IMAGING, TESTING) - I reviewed patient records, labs, notes, testing and imaging myself where available.  Lab Results  Component Value Date   WBC 14.6 (H) 12/20/2012   HGB 15.2 (H) 12/20/2012   HCT 45.0 12/20/2012   MCV 89.1 12/20/2012   PLT 224 12/20/2012      Component Value Date/Time   NA 135 12/20/2012 0725   K 4.1 12/20/2012 0725   CL 98 12/20/2012 0725   CO2 27 12/20/2012 0725   GLUCOSE 99 12/20/2012 0725   BUN 13 12/20/2012 0725   CREATININE 0.63 12/20/2012 0725   CALCIUM 9.0 12/20/2012 0725   GFRNONAA >90 12/20/2012 0725   GFRAA >90 12/20/2012 0725   No results found for: "CHOL", "HDL", "LDLCALC", "LDLDIRECT", "TRIG", "CHOLHDL" No results found for: "HGBA1C" No results found for: "VITAMINB12" No results found for: "TSH"    ASSESSMENT AND PLAN  61 y.o. year old female with history of seizure disorder who is presenting to establish care. Patient reports being seizure free for the past 35 years while on Depakote and Clonazepam. Plan will be to discontinue Clonazepam in the near future. Currently she is taking 0.25/0.25/0.5, will plan to decrease it to 0.25 mg BID. I will continue her  Depakote and the same dose but once she is off Clonazepam, will start the conversation to reduce then discontinue Depakote (Trial). She voices understanding. I will see her in 6 months for follow up.    1. Seizure disorder (HCC)   2. Therapeutic drug monitoring     Patient Instructions  Decrease Clonazepam to 0.25 twice daily  Plan to discontinue Klonopin in the next couple of months  Patient to contact me in a month to send new refills for Clonazepam  Continue with Depakote 250 mg TID  Ambulatory EEG  Follow up in 6 months or sooner if worse   Orders Placed This Encounter  Procedures   Valproic Acid Level   AMBULATORY EEG    Meds ordered this encounter  Medications   conjugated estrogens (PREMARIN) vaginal cream    Sig: Premarin 0.625 mg/gram vaginal cream. Insert 0.5 applicatorsful twice a week by vaginal route.    Dispense:  42.5 g    Refill:  0    Return in about 6 months (around 11/09/2022).    Windell Norfolk, MD 05/09/2022, 1:29 PM  [facility] [address], [address] [place], [place] [address] [phone]

## 2022-05-09 NOTE — Patient Instructions (Addendum)
Decrease Clonazepam to 0.25 twice daily  Plan to discontinue Klonopin in the next couple of months  Patient to contact me in a month to send new refills for Clonazepam  Continue with Depakote 250 mg TID  Ambulatory EEG  Follow up in 6 months or sooner if worse

## 2022-05-10 LAB — VALPROIC ACID LEVEL: Valproic Acid Lvl: 65 ug/mL (ref 50–100)

## 2022-05-22 ENCOUNTER — Ambulatory Visit: Payer: No Typology Code available for payment source | Admitting: Neurology

## 2022-06-07 ENCOUNTER — Encounter: Payer: Self-pay | Admitting: Neurology

## 2022-06-07 ENCOUNTER — Telehealth: Payer: Self-pay

## 2022-06-07 MED ORDER — CLONAZEPAM 0.5 MG PO TABS: 0.2500 mg | ORAL_TABLET | Freq: Two times a day (BID) | ORAL | 0 refills | Status: DC | PRN

## 2022-06-07 MED ORDER — CLONAZEPAM 0.5 MG PO TABS: 0.2500 mg | ORAL_TABLET | Freq: Two times a day (BID) | ORAL | 0 refills | Status: DC

## 2022-06-07 NOTE — Addendum Note (Signed)
Addended by: Christophe Louis E on: 06/07/2022 01:08 PM   Modules accepted: Orders

## 2022-06-07 NOTE — Telephone Encounter (Signed)
Error

## 2022-06-07 NOTE — Telephone Encounter (Signed)
Last office visit note on 05/09/2022 states:  Decrease Clonazepam to 0.25 twice daily  Plan to discontinue Klonopin in the next couple of months  Patient to contact me in a month to send new refills for Clonazepam   Verify Drug Registry For Clonazepam 0.5 Mg Tablet Last Filled: 04/06/2022 Quantity: 90 tablets for 30 days Last appointment: 05/09/2022 Next appointment: 11/20/2022

## 2022-06-07 NOTE — Telephone Encounter (Signed)
The patient is requesting the medication to be sent to [facility] [address] in [place]. [facility] have called [facility] and spoke with [place] who voided the Clonazepam 0.25 mg rx. A new rx is required. [facility] has been [facility].

## 2022-06-28 ENCOUNTER — Encounter: Payer: Self-pay | Admitting: Neurology

## 2022-07-07 ENCOUNTER — Other Ambulatory Visit: Payer: Self-pay | Admitting: Neurology

## 2022-07-10 ENCOUNTER — Other Ambulatory Visit: Payer: Self-pay | Admitting: Neurology

## 2022-07-10 MED ORDER — CLONAZEPAM 0.5 MG PO TABS: 0.2500 mg | ORAL_TABLET | Freq: Two times a day (BID) | ORAL | 0 refills | Status: DC | PRN

## 2022-07-12 NOTE — Telephone Encounter (Signed)
Verify Drug Registry For Clonazepam 0.5 Mg Tablet Last Filled: 06/07/2022 Quantity: 30 tablets for 30 days Last appointment: 05/09/2022 Next appointment: 11/20/2022

## 2022-08-04 ENCOUNTER — Other Ambulatory Visit: Payer: Self-pay | Admitting: Neurology

## 2022-08-08 ENCOUNTER — Encounter: Payer: Self-pay | Admitting: Neurology

## 2022-08-08 MED ORDER — CLONAZEPAM 0.5 MG PO TABS: 0.2500 mg | ORAL_TABLET | Freq: Every day | ORAL | 0 refills | Status: DC

## 2022-08-08 NOTE — Progress Notes (Signed)
Patient Klonopin further decrease to 0.25 mg daily. New Rx sent to pharmacy.   Dr. April Manson

## 2022-08-08 NOTE — Telephone Encounter (Signed)
Patient is due for a refill on klonopin. Patient is up to date on her appointments. [place] Controlled Substance Registry checked and is appropriate.

## 2022-08-21 ENCOUNTER — Encounter: Payer: Self-pay | Admitting: Neurology

## 2022-08-21 MED ORDER — DIVALPROEX SODIUM 250 MG PO DR TAB: 250.0000 mg | DELAYED_RELEASE_TABLET | Freq: Three times a day (TID) | ORAL | 3 refills | Status: DC

## 2022-09-26 ENCOUNTER — Encounter: Payer: Self-pay | Admitting: Neurology

## 2022-09-26 MED ORDER — DIVALPROEX SODIUM 250 MG PO DR TAB: 250.0000 mg | DELAYED_RELEASE_TABLET | Freq: Three times a day (TID) | ORAL | 3 refills | Status: AC

## 2022-11-02 ENCOUNTER — Other Ambulatory Visit: Payer: Self-pay | Admitting: Neurology

## 2022-11-07 MED ORDER — CLONAZEPAM 0.5 MG PO TABS: 0.2500 mg | ORAL_TABLET | Freq: Two times a day (BID) | ORAL | 0 refills | Status: DC | PRN

## 2022-11-10 DIAGNOSIS — G40909 Epilepsy, unspecified, not intractable, without status epilepticus: Secondary | ICD-10-CM | POA: Diagnosis not present

## 2022-11-17 ENCOUNTER — Encounter: Payer: Self-pay | Admitting: Neurology

## 2022-11-17 DIAGNOSIS — G40909 Epilepsy, unspecified, not intractable, without status epilepticus: Secondary | ICD-10-CM

## 2022-11-17 NOTE — Procedures (Signed)
Clinical History : This is a 62y/o F who presents with a history of seizure, thyroid goiter and vitamin D deficiency who is presenting to establish care for abdominal seizures. She states her seizures started in childhood where she was 'missing time' and did not know what was going on. She was not diagnosed until 62 years old during her menses, she will complain of increased abdominal pain associated with nausea, vomiting and go into a generalized convulsion. She reports she has been seizure free for the past 35 years on a combination of Depakote and Klonopin.  INTERMITTENT MONITORING with VIDEO TECHNICAL SUMMARY: This AVEEG was performed using equipment provided by [employer] utilizing Bluetooth ( [employer] ) amplifiers with continuous EEGT attended video collection using encrypted remote transmission via [place] secured cellular tower network with data rates for each AVEEG performed. This is a Biomedical engineer AVEEG, obtained, according to the 10-20 international electrode placement system, reformatted digitally into referential and bipolar montages. Data was acquired with a minimum of 21 bipolar connections and sampled at a minimum rate of 250 cycles per second per channel, maximum rate of 450 cycles per second per channel and two channels for EKG. The entire VEEG study was recorded through cable and or radio telemetry for subsequent analysis. Specified epochs of the AVEEG data were identified at the direction of the subject by the depression of a push button by the patient. Each patients event file included data acquired two minutes prior to the push button activation and continuing until two minutes afterwards. AVEEG files were reviewed on [place], Licensed Software provided by [employer] with a digital high frequency filter set at 70 Hz and a low frequency filter set at 1 Hz with a paper speed of 39m/s resulting in 10 seconds per digital page. This entire AVEEG was  reviewed by the EEG Technologist. Random time samples, random sleep samples, clips, patient initiated push button files with included patient daily diary logs, EEG Technologist pruned data was reviewed and verified for accuracy and validity by the governing reading neurologist in full details. This AEEGV was fully compliant with all requirements for CPT 97500 for setup, patient education, take down and administered by an EEG technologist. Long-Term EEG with Video was monitored intermittently by a qualified EEG technologist for the entirety of the recording; quality check-ins were performed at a minimum of every two hours, checking and documenting real-time data and video to assure the integrity and quality of the recording (e.g., camera position, electrode integrity and impedance), and identify the need for maintenance. For intermittent monitoring, an EEG Technologist monitored no more than 12 patients concurrently. Diagnostic video was captured at least 80% of the time during the recording.  PATIENT EVENTS: There were no patient events noted or captured during this recording.  TECHNOLOGIST EVENTS: No clear epileptiform activity was detected by the reviewing neurodiagnostic technologist during the recording for further evaluation.  TIME SAMPLES: 10-minutes of every 2 hours recorded are reviewed as random time samples.  SLEEP SAMPLES: 5-minutes of every 24 hours recorded are reviewed as random sleep samples.  AWAKE: At maximal level of alertness, the posterior dominant background activity was continuous, reactive, low voltage rhythm of 9 Hz. This was symmetric, well-modulated, and attenuated with eye opening. Diffuse, symmetric, frontocentral beta range activity was present.  SLEEP: N1 Sleep (Stage 1) was observed and characterized by the disappearance of alpha rhythm and the appearance of vertex activity. N2 Sleep (Stage 2) was observed and characterized by vertex waves, K-complexes,  and sleep  spindles. N3 (Stage 3) sleep was observed and characterized by high amplitude Delta activity of 20%. REM sleep was observed.  EKG: There were no arrhythmias or abnormalities noted during this recording.   Impression: This is a normal video ambulatory EEG. There were no seizures of epileptiform discharges seen during this recording. Normal EEGs, however does not rule out epilepsy.    Alric Ran, MD [facility]

## 2022-11-20 ENCOUNTER — Ambulatory Visit (INDEPENDENT_AMBULATORY_CARE_PROVIDER_SITE_OTHER): Payer: Managed Care, Other (non HMO) | Admitting: Neurology

## 2022-11-20 ENCOUNTER — Encounter: Payer: Self-pay | Admitting: Neurology

## 2022-11-20 VITALS — BP 126/76 | HR 89 | Ht 64.75 in | Wt 148.0 lb

## 2022-11-20 DIAGNOSIS — G40909 Epilepsy, unspecified, not intractable, without status epilepticus: Secondary | ICD-10-CM

## 2022-11-20 NOTE — Patient Instructions (Signed)
Continue with Depakote 250 mg 3 times daily Continue with Klonopin 0.25 mg twice daily, at next refill dose will be decreased to 0.25 mg daily Follow-up in 6 months or sooner if worse.

## 2022-11-20 NOTE — Progress Notes (Signed)
[facility]  PATIENT: Crystal Miranda DOB: Jan 17, 1961  REQUESTING CLINICIAN: Ocie Doyne., MD HISTORY FROM: Patient  REASON FOR VISIT: Establish care for Seizure    HISTORICAL  CHIEF COMPLAINT:  Chief Complaint  Patient presents with   Follow-up    Rm 12. Alone. No new seizure activity.    INTERVAL HISTORY 11/20/2022:  Patient presents today for follow-up, last visit was in August.  Since then we have decreased her Klonopin to 0.25 mg 3 times daily to 0.25 mg twice daily.  She remains on Depakote 250 mg 3 times daily.  She denies any seizure or seizure-like activity.  Her most recent ambulatory EEG was normal, no epileptiform discharges and no events.  Overall she is doing well.    HISTORY OF PRESENT ILLNESS:  This is a 62 year old woman with history of seizure, history of thyroid goiter and vitamin D deficiency who is presenting to establish care for her "abdominal seizures".  Patient reports a long history of seizure, stated the seizures started in childhood when she was missing time initially and did not know what was going on.  She was not initially diagnosed until she turned 11, during her menses, she will complain of worse abdominal pain associated with nausea and vomiting and then she will go into a generalized convulsion.  She was diagnosed with seizure at that time. Initially, she was put on Dilantin which did not control the seizure and phenobarbital was added.  She reports with those two medications, she was very somnolent and drowsy, therefore she had to be taken out of school and home schooled initially. She also reported being on Tegretol during her pregnancy but later developed severe rash, and last antiseizure medication that she has been on are Depakote and Klonopin for the past 35 years.  Currently she is on Depakote 250 mg 3 times daily and Klonopin 0.5 mg 3 times daily, but she reported taking the Klonopin differently, 0.25 mg in the morning,  0.25 mg in the afternoon and 0.5 mg at night.  However in the past year, her drug screen has been negative for benzodiazepine.  Patient reports that when she takes to generic Klonopin/Clonazepam, urine drug test will be negative.  She reports she has been seizure-free on the combination of Depakote and Klonopin for the past 35 years.   OTHER MEDICAL CONDITIONS: History of seizures, history of goiter, Vitamin D deficiency    REVIEW OF SYSTEMS: Full 14 system review of systems performed and negative with exception of: as noted in the HPI   ALLERGIES: Allergies  Allergen Reactions   Codeine    Demerol [Meperidine]    Dilantin [Phenytoin Sodium Extended]    Penicillins    Tegretol [Carbamazepine]     HOME MEDICATIONS: Outpatient Medications Prior to Visit  Medication Sig Dispense Refill   acetaminophen (TYLENOL) 500 MG tablet Take 500 mg by mouth every 6 (six) hours as needed for pain.     calcium-vitamin D (OSCAL WITH D) 500-200 MG-UNIT per tablet Take 1 tablet by mouth daily.     cetirizine (ZYRTEC) 10 MG tablet Take 1 tablet (10 mg total) by mouth daily. 30 tablet 11   clonazePAM (KLONOPIN) 0.5 MG tablet Take 0.5 tablets (0.25 mg total) by mouth 2 (two) times daily as needed for anxiety. 30 tablet 0   divalproex (DEPAKOTE) 250 MG DR tablet Take 1 tablet (250 mg total) by mouth 3 (three) times daily. 270 tablet 3   ibuprofen (ADVIL,MOTRIN) 800 MG  tablet Take 1 tablet (800 mg total) by mouth 3 (three) times daily. 21 tablet 0   Multiple Vitamin (MULTIVITAMIN WITH MINERALS) TABS Take 1 tablet by mouth 3 (three) times daily.     naproxen (NAPROSYN) 500 MG tablet Take 1 tablet (500 mg total) by mouth 2 (two) times daily. 30 tablet 0   clonazePAM (KLONOPIN) 0.5 MG tablet Take 0.5 tablets (0.25 mg total) by mouth daily. To last for 30 days. 15 tablet 0   conjugated estrogens (PREMARIN) vaginal cream Premarin 0.625 mg/gram vaginal cream. Insert 0.5 applicatorsful twice a week by vaginal route.  42.5 g 0   cyclobenzaprine (FLEXERIL) 5 MG tablet Take 1 tablet (5 mg total) by mouth 2 (two) times daily as needed for muscle spasms. 20 tablet 0   No facility-administered medications prior to visit.    PAST MEDICAL HISTORY: Past Medical History:  Diagnosis Date   Seizures ([place])     PAST SURGICAL HISTORY: Past Surgical History:  Procedure Laterality Date   ABDOMINAL HYSTERECTOMY     CESAREAN SECTION      FAMILY HISTORY: Family History  Problem Relation Age of Onset   Cancer Father    Cancer Brother     SOCIAL HISTORY: Social History   Socioeconomic History   Marital status: Widowed    Spouse name: Not on file   Number of children: Not on file   Years of education: Not on file   Highest education level: Not on file  Occupational History   Not on file  Tobacco Use   Smoking status: Every Day    Packs/day: 0.50    Types: Cigarettes   Smokeless tobacco: Never  Substance and Sexual Activity   Alcohol use: No   Drug use: No   Sexual activity: Not on file  Other Topics Concern   Not on file  Social History Narrative   Not on file   Social Determinants of Health   Financial Resource Strain: Not on file  Food Insecurity: Not on file  Transportation Needs: Not on file  Physical Activity: Not on file  Stress: Not on file  Social Connections: Not on file  Intimate Partner Violence: Not on file    PHYSICAL EXAM  GENERAL EXAM/CONSTITUTIONAL: Vitals:  Vitals:   11/20/22 1151  BP: 126/76  Pulse: 89  Weight: 148 lb (67.1 kg)  Height: 5' 4.75" (1.645 m)   Body mass index is 24.82 kg/m. Wt Readings from Last 3 Encounters:  11/20/22 148 lb (67.1 kg)  05/09/22 148 lb (67.1 kg)  05/13/15 142 lb (64.4 kg)   Patient is in no distress; well developed, nourished and groomed; neck is supple  EYES: Visual fields full to confrontation, Extraocular movements intacts,   MUSCULOSKELETAL: Gait, strength, tone, movements noted in Neurologic exam  below  NEUROLOGIC: MENTAL STATUS:      No data to display         awake, alert, oriented to person, place and time recent and remote memory intact normal attention and concentration language fluent, comprehension intact, naming intact fund of knowledge appropriate  CRANIAL NERVE:  2nd, 3rd, 4th, 6th - visual fields full to confrontation, extraocular muscles intact, no nystagmus 5th - facial sensation symmetric 7th - facial strength symmetric 8th - hearing intact 9th - palate elevates symmetrically, uvula midline 11th - shoulder shrug symmetric 12th - tongue protrusion midline  MOTOR:  normal bulk and tone, full strength in the BUE, BLE  SENSORY:  normal and symmetric to light touch  COORDINATION:  finger-nose-finger, fine finger movements normal  REFLEXES:  deep tendon reflexes present and symmetric  GAIT/STATION:  normal   DIAGNOSTIC DATA (LABS, IMAGING, TESTING) - I reviewed patient records, labs, notes, testing and imaging myself where available.  Lab Results  Component Value Date   WBC 14.6 (H) 12/20/2012   HGB 15.2 (H) 12/20/2012   HCT 45.0 12/20/2012   MCV 89.1 12/20/2012   PLT 224 12/20/2012      Component Value Date/Time   NA 135 12/20/2012 0725   K 4.1 12/20/2012 0725   CL 98 12/20/2012 0725   CO2 27 12/20/2012 0725   GLUCOSE 99 12/20/2012 0725   BUN 13 12/20/2012 0725   CREATININE 0.63 12/20/2012 0725   CALCIUM 9.0 12/20/2012 0725   GFRNONAA >90 12/20/2012 0725   GFRAA >90 12/20/2012 0725   No results found for: "CHOL", "HDL", "LDLCALC", "LDLDIRECT", "TRIG", "CHOLHDL" No results found for: "HGBA1C" No results found for: "VITAMINB12" No results found for: "TSH"   Amb EEG 11/17/2022 This is a normal video ambulatory EEG. There were no seizures of epileptiform discharges seen during this recording. Normal EEGs, however does not rule out epilepsy   ASSESSMENT AND PLAN  62 y.o. year old female with history of seizure disorder who is  presenting for follow up.  No seizures since last visit in August.  Currently she is on Depakote 250 mg 3 times daily and Klonopin 0.25 mg twice daily.  Plan is for patient to continue current medications for now and we will plan to discontinue Klonopin.  She will remain on Depakote 250 mg 3 times daily.  This was discussed with the patient and she is comfortable with plans.  I will see her in 6 months for follow-up.   1. Seizure disorder [facility])     Patient Instructions  Continue with Depakote 250 mg 3 times daily Continue with Klonopin 0.25 mg twice daily, at next refill dose will be decreased to 0.25 mg daily Follow-up in 6 months or sooner if worse.  No orders of the defined types were placed in this encounter.   No orders of the defined types were placed in this encounter.   Return in about 6 months (around 05/21/2023).  I have spent a total of 30 minutes dedicated to this patient today, preparing to see patient, performing a medically appropriate examination and evaluation, ordering tests and/or medications and procedures, and counseling and educating the patient/family/caregiver; independently interpreting result and communicating results to the family/patient/caregiver; and documenting clinical information in the electronic medical record.   Alric Ran, MD 11/20/2022, 5:58 PM  [facility] [address], [place], [place] [address] [phone]

## 2022-12-05 ENCOUNTER — Other Ambulatory Visit: Payer: Self-pay | Admitting: Neurology

## 2022-12-06 ENCOUNTER — Other Ambulatory Visit: Payer: Self-pay | Admitting: Neurology

## 2022-12-07 ENCOUNTER — Other Ambulatory Visit: Payer: Self-pay | Admitting: Neurology

## 2022-12-07 MED ORDER — CLONAZEPAM 0.5 MG PO TABS: 0.2500 mg | ORAL_TABLET | Freq: Two times a day (BID) | ORAL | 0 refills | Status: DC | PRN

## 2023-01-04 ENCOUNTER — Other Ambulatory Visit: Payer: Self-pay | Admitting: Neurology

## 2023-01-08 ENCOUNTER — Other Ambulatory Visit: Payer: Self-pay | Admitting: Neurology

## 2023-01-08 NOTE — Telephone Encounter (Signed)
#  15 tablets must lat 30 days

## 2023-01-08 NOTE — Telephone Encounter (Signed)
Requested Prescriptions   Pending Prescriptions Disp Refills   clonazePAM (KLONOPIN) 0.5 MG tablet [Pharmacy Med Name: CLONAZEPAM 0.5 MG TABLET] 30 tablet 0    Sig: TAKE 0.5 TABLETS BY MOUTH 2 TIMES DAILY AS NEEDED FOR ANXIETY.   Last seen by Camara 11/20/22, upcoming 05/28/23 Dispenses   Dispensed Days Supply Quantity Provider Pharmacy  CLONAZEPAM 0.5 MG TABLET 12/07/2022 30 30 each Windell Norfolk, [place] [id number] [place]  CLONAZEPAM 0.5 MG TABLET 11/07/2022 30 30 each Windell Norfolk, [place] [id number]  CLONAZEPAM 0.5 MG TABLET 10/06/2022 30 30 each Windell Norfolk, [place] [id number]  CLONAZEPAM 0.5 MG TABLET 09/05/2022 30 15 each Windell Norfolk, MD [facility] [phone] - [place]...  CLONAZEPAM 0.5 MG TABLET 08/07/2022 30 30 each Windell Norfolk, MD [facility] [phone] - [place]...  CLONAZEPAM 0.5 MG TABLET 07/10/2022 30 30 each Dohmeier, Porfirio Mylar, MD [facility] [phone] - [place]...  CLONAZEPAM 0.5 MG TABLET 06/07/2022 30 30 each Windell Norfolk, [place] [id number]  CLONAZEPAM 0.5 MG TABLET 04/06/2022 30 90 each Gus Height, [place] [facility] [phone] - [facility]...  CLONAZEPAM 0.5 MG TABLET 02/21/2022 30 90 each Gus Height, [place] [facility] [phone] - [facility]...  CLONAZEPAM 0.5 MG TABLET 01/18/2022 30 90 each Gus Height, [place] [facility] [phone] - [facility]...       Routing to provider to fill

## 2023-05-06 ENCOUNTER — Other Ambulatory Visit: Payer: Self-pay | Admitting: Neurology

## 2023-05-07 NOTE — Telephone Encounter (Signed)
Requested Prescriptions   Pending Prescriptions Disp Refills   clonazePAM (KLONOPIN) 0.5 MG tablet [Pharmacy Med Name: CLONAZEPAM 0.5 MG TABLET] 15 tablet     Sig: TAKE 1/2 OF A TABLET (0.25 MG TOTAL) BY MOUTH EVERY DAY   Last seen 11/20/22, next appt scheduled for 05/28/23  Dispenses   Dispensed Days Supply Quantity Provider Pharmacy  CLONAZEPAM 0.5 MG TABLET 04/06/2023 30 15 each Windell Norfolk, MD [id number]  CLONAZEPAM 0.5 MG TABLET 03/09/2023 30 15 each Windell Norfolk, [place] [id number]  CLONAZEPAM 0.5 MG TABLET 02/05/2023 30 15 each Windell Norfolk, [place] [id number]  CLONAZEPAM 0.5 MG TABLET 01/08/2023 30 15 each Windell Norfolk, [place] [id number]  CLONAZEPAM 0.5 MG TABLET 12/07/2022 30 30 each Windell Norfolk, [place] [id number]  CLONAZEPAM 0.5 MG TABLET 11/07/2022 30 30 each Windell Norfolk, [place] [id number]  CLONAZEPAM 0.5 MG TABLET 10/06/2022 30 30 each Windell Norfolk, [place] [id number]...  CLONAZEPAM 0.5 MG TABLET 09/05/2022 30 15 each Windell Norfolk, [place] [facility] [phone] - [facility]...  CLONAZEPAM 0.5 MG TABLET 08/07/2022 30 30 each Windell Norfolk, [place] [facility] [phone] - J...  CLONAZEPAM 0.5 MG TABLET 07/10/2022 30 30 each Dohmeier, Porfirio Mylar, [place] [facility] [phone] - [facility]...  CLONAZEPAM 0.5 MG TABLET 06/07/2022 30 30 each Windell Norfolk, [place] [id number] KitchenMarland Kitchen

## 2023-05-28 ENCOUNTER — Encounter: Payer: Self-pay | Admitting: Neurology

## 2023-05-28 ENCOUNTER — Ambulatory Visit (INDEPENDENT_AMBULATORY_CARE_PROVIDER_SITE_OTHER): Payer: Managed Care, Other (non HMO) | Admitting: Neurology

## 2023-05-28 VITALS — BP 148/82 | HR 83 | Ht 64.75 in | Wt 150.0 lb

## 2023-05-28 DIAGNOSIS — G40909 Epilepsy, unspecified, not intractable, without status epilepticus: Secondary | ICD-10-CM | POA: Diagnosis not present

## 2023-05-28 DIAGNOSIS — Z5181 Encounter for therapeutic drug level monitoring: Secondary | ICD-10-CM | POA: Diagnosis not present

## 2023-05-28 NOTE — Progress Notes (Signed)
[facility]  PATIENT: Crystal Miranda DOB: [birth year]  REQUESTING CLINICIAN: No ref. provider found HISTORY FROM: Patient  REASON FOR VISIT: Establish care for Seizure    HISTORICAL  CHIEF COMPLAINT:  Chief Complaint  Patient presents with   Seizures    Rm13, alone, pt is an rn Sz:pt stated that last one occurred in spring of 2023, over well doing well, she mentioned tapering off klonopin and that has been challenging     INTERVAL HISTORY 05/28/2023:  Patient presents today for follow-up, last visit was in February since then she has not had any seizure or seizure like activity.  She is still on the Klonopin but has decreased the dose to 0.125 mg twice daily.  She remains on Depakote 250 mg 3 times daily.  She reports initially when she decreased the Klonopin from 0.25 mg twice daily to 0.25 mg daily she did have some weird symptom like auras but no seizures, she could not sleep at night because the was taking the tablet in the morning and that is the reason she split the dose to 0.125 mg twice daily.  Overall she is doing well and she is looking forward to come off the Klonopin.   INTERVAL HISTORY 11/20/2022:  Patient presents today for follow-up, last visit was in August.  Since then we have decreased her Klonopin to 0.25 mg 3 times daily to 0.25 mg twice daily.  She remains on Depakote 250 mg 3 times daily.  She denies any seizure or seizure-like activity.  Her most recent ambulatory EEG was normal, no epileptiform discharges and no events.  Overall she is doing well.    HISTORY OF PRESENT ILLNESS:  This is a 62 year old woman with history of seizure, history of thyroid goiter and vitamin D deficiency who is presenting to establish care for her "abdominal seizures".  Patient reports a long history of seizure, stated the seizures started in childhood when she was missing time initially and did not know what was going on.  She was not initially diagnosed until  she turned 11, during her menses, she will complain of worse abdominal pain associated with nausea and vomiting and then she will go into a generalized convulsion.  She was diagnosed with seizure at that time. Initially, she was put on Dilantin which did not control the seizure and phenobarbital was added.  She reports with those two medications, she was very somnolent and drowsy, therefore she had to be taken out of school and home schooled initially. She also reported being on Tegretol during her pregnancy but later developed severe rash, and last antiseizure medication that she has been on are Depakote and Klonopin for the past 35 years.  Currently she is on Depakote 250 mg 3 times daily and Klonopin 0.5 mg 3 times daily, but she reported taking the Klonopin differently, 0.25 mg in the morning, 0.25 mg in the afternoon and 0.5 mg at night.  However in the past year, her drug screen has been negative for benzodiazepine.  Patient reports that when she takes to generic Klonopin/Clonazepam, urine drug test will be negative.  She reports she has been seizure-free on the combination of Depakote and Klonopin for the past 35 years.   OTHER MEDICAL CONDITIONS: History of seizures, history of goiter, Vitamin D deficiency    REVIEW OF SYSTEMS: Full 14 system review of systems performed and negative with exception of: as noted in the HPI   ALLERGIES: Allergies  Allergen Reactions  Carbamazepine Hives   Latex Hives, Rash and Shortness Of Breath   Meperidine Rash   Meperidine Hcl Rash    Other Reaction(s): seizure   Phenytoin Sodium Extended Rash    Other Reaction(s): fever   Penicillin G     Other Reaction(s): edema   Dilantin [Phenytoin Sodium Extended]    Penicillins    Codeine Rash   Sulfamethoxazole Rash    HOME MEDICATIONS: Outpatient Medications Prior to Visit  Medication Sig Dispense Refill   acetaminophen (TYLENOL) 500 MG tablet Take 500 mg by mouth every 6 (six) hours as needed for  pain.     calcium-vitamin D (OSCAL WITH D) 500-200 MG-UNIT per tablet Take 1 tablet by mouth daily.     clonazePAM (KLONOPIN) 0.5 MG tablet Take 0.5 tablets (0.25 mg total) by mouth 2 (two) times daily as needed for anxiety. 60 tablet 0   divalproex (DEPAKOTE) 250 MG DR tablet Take 1 tablet (250 mg total) by mouth 3 (three) times daily. 270 tablet 3   Multiple Vitamin (MULTIVITAMIN WITH MINERALS) TABS Take 1 tablet by mouth 3 (three) times daily.     clonazePAM (KLONOPIN) 0.5 MG tablet TAKE 1/2 OF A TABLET (0.25 MG TOTAL) BY MOUTH EVERY DAY 15 tablet 3   cetirizine (ZYRTEC) 10 MG tablet Take 1 tablet (10 mg total) by mouth daily. 30 tablet 11   ibuprofen (ADVIL,MOTRIN) 800 MG tablet Take 1 tablet (800 mg total) by mouth 3 (three) times daily. 21 tablet 0   naproxen (NAPROSYN) 500 MG tablet Take 1 tablet (500 mg total) by mouth 2 (two) times daily. 30 tablet 0   No facility-administered medications prior to visit.    PAST MEDICAL HISTORY: Past Medical History:  Diagnosis Date   Seizures ([facility])     PAST SURGICAL HISTORY: Past Surgical History:  Procedure Laterality Date   ABDOMINAL HYSTERECTOMY     CESAREAN SECTION      FAMILY HISTORY: Family History  Problem Relation Age of Onset   Cancer Father    Cancer Brother     SOCIAL HISTORY: Social History   Socioeconomic History   Marital status: Widowed    Spouse name: Not on file   Number of children: 2   Years of education: Not on file   Highest education level: Associate degree: occupational, Scientist, product/process development, or vocational program  Occupational History   Not on file  Tobacco Use   Smoking status: Every Day    Current packs/day: 0.50    Types: Cigarettes   Smokeless tobacco: Never  Vaping Use   Vaping status: Never Used  Substance and Sexual Activity   Alcohol use: No   Drug use: No   Sexual activity: Yes    Birth control/protection: None, Surgical    Comment: hysterectomy 2002  Other Topics Concern   Not on file  Social  History Narrative   Not on file   Social Determinants of Health   Financial Resource Strain: Not on file  Food Insecurity: Not on file  Transportation Needs: Not on file  Physical Activity: Not on file  Stress: Not on file  Social Connections: Not on file  Intimate Partner Violence: Not on file    PHYSICAL EXAM  GENERAL EXAM/CONSTITUTIONAL: Vitals:  Vitals:   05/28/23 1129  BP: (!) 148/82  Pulse: 83  Weight: 150 lb (68 kg)  Height: 5' 4.75" (1.645 m)   Body mass index is 25.15 kg/m. Wt Readings from Last 3 Encounters:  05/28/23 150 lb (68 kg)  11/20/22 148 lb (67.1 kg)  05/09/22 148 lb (67.1 kg)   Patient is in no distress; well developed, nourished and groomed; neck is supple  EYES: Visual fields full to confrontation, Extraocular movements intacts,   MUSCULOSKELETAL: Gait, strength, tone, movements noted in Neurologic exam below  NEUROLOGIC: MENTAL STATUS:      No data to display         awake, alert, oriented to person, place and time recent and remote memory intact normal attention and concentration language fluent, comprehension intact, naming intact fund of knowledge appropriate  CRANIAL NERVE:  2nd, 3rd, 4th, 6th - visual fields full to confrontation, extraocular muscles intact, no nystagmus 5th - facial sensation symmetric 7th - facial strength symmetric 8th - hearing intact 9th - palate elevates symmetrically, uvula midline 11th - shoulder shrug symmetric 12th - tongue protrusion midline  MOTOR:  normal bulk and tone, full strength in the BUE, BLE  SENSORY:  normal and symmetric to light touch  COORDINATION:  finger-nose-finger, fine finger movements normal  GAIT/STATION:  normal   DIAGNOSTIC DATA (LABS, IMAGING, TESTING) - I reviewed patient records, labs, notes, testing and imaging myself where available.  Lab Results  Component Value Date   WBC 14.6 (H) 12/20/2012   HGB 15.2 (H) 12/20/2012   HCT 45.0 12/20/2012   MCV 89.1  12/20/2012   PLT 224 12/20/2012      Component Value Date/Time   NA 135 12/20/2012 0725   K 4.1 12/20/2012 0725   CL 98 12/20/2012 0725   CO2 27 12/20/2012 0725   GLUCOSE 99 12/20/2012 0725   BUN 13 12/20/2012 0725   CREATININE 0.63 12/20/2012 0725   CALCIUM 9.0 12/20/2012 0725   GFRNONAA >90 12/20/2012 0725   GFRAA >90 12/20/2012 0725   No results found for: "CHOL", "HDL", "LDLCALC", "LDLDIRECT", "TRIG", "CHOLHDL" No results found for: "HGBA1C" No results found for: "VITAMINB12" No results found for: "TSH"   Amb EEG 11/17/2022 This is a normal video ambulatory EEG. There were no seizures of epileptiform discharges seen during this recording. Normal EEGs, however does not rule out epilepsy   ASSESSMENT AND PLAN  62 y.o. year old female with history of seizure disorder who is presenting for follow up.  No seizures since last visit.  Currently she is on Depakote 250 mg 3 times daily and Klonopin 0.125 mg twice daily.  Plan is for patient to continue current medications for now and we will plan to discontinue Klonopin.  She will remain on Depakote 250 mg 3 times daily, in the case she has a seizure or increase auras, will increase the Depakote to 500 mg twice daily .  This was discussed with the patient and she is comfortable with plans.  I will see her in 6 months for follow-up.   1. Seizure disorder ([facility])   2. Therapeutic drug monitoring     Patient Instructions  Continue with Klonopin 0.125 mg twice daily Continue with Depakote 250 mg 3 times daily Continue with medication In the case of a breakthrough seizure or increased seizure auras, will increase her Depakote to 500 mg twice daily.   Will obtain depakote level and LFTs.  Follow-up with 31-month or sooner if worse.   Orders Placed This Encounter  Procedures   Valproic Acid Level   CMP    No orders of the defined types were placed in this encounter.   Return in about 6 months (around 11/28/2023).   Windell Norfolk, MD 05/28/2023, 10:11 PM  [facility] [address] [place], [place] [address] [phone]

## 2023-05-28 NOTE — Patient Instructions (Addendum)
Continue with Klonopin 0.125 mg twice daily Continue with Depakote 250 mg 3 times daily Continue with medication In the case of a breakthrough seizure or increased seizure auras, will increase her Depakote to 500 mg twice daily.   Will obtain depakote level and LFTs.  Follow-up with 6-month or sooner if worse.

## 2023-05-29 LAB — COMPREHENSIVE METABOLIC PANEL
ALT: 10 IU/L (ref 0–32)
AST: 10 IU/L (ref 0–40)
Albumin: 3.9 g/dL (ref 3.9–4.9)
Alkaline Phosphatase: 78 IU/L (ref 44–121)
BUN/Creatinine Ratio: 12 (ref 12–28)
BUN: 7 mg/dL — ABNORMAL LOW (ref 8–27)
Bilirubin Total: 0.2 mg/dL (ref 0.0–1.2)
CO2: 24 mmol/L (ref 20–29)
Calcium: 9.5 mg/dL (ref 8.7–10.3)
Chloride: 102 mmol/L (ref 96–106)
Creatinine, Ser: 0.58 mg/dL (ref 0.57–1.00)
Globulin, Total: 2.6 g/dL (ref 1.5–4.5)
Glucose: 91 mg/dL (ref 70–99)
Potassium: 4 mmol/L (ref 3.5–5.2)
Sodium: 139 mmol/L (ref 134–144)
Total Protein: 6.5 g/dL (ref 6.0–8.5)
eGFR: 102 mL/min/{1.73_m2} (ref >59)

## 2023-05-29 LAB — VALPROIC ACID LEVEL: Valproic Acid Lvl: 45 ug/mL — ABNORMAL LOW (ref 50–100)

## 2023-05-31 ENCOUNTER — Other Ambulatory Visit: Payer: Self-pay | Admitting: Oncology

## 2023-05-31 DIAGNOSIS — Z006 Encounter for examination for normal comparison and control in clinical research program: Secondary | ICD-10-CM

## 2023-06-27 ENCOUNTER — Other Ambulatory Visit: Payer: Self-pay | Admitting: Neurology

## 2023-06-27 ENCOUNTER — Telehealth: Payer: Self-pay

## 2023-06-27 MED ORDER — CLONAZEPAM 0.5 MG PO TABS: 0.2500 mg | ORAL_TABLET | Freq: Two times a day (BID) | ORAL | 0 refills | Status: DC | PRN

## 2023-06-27 NOTE — Telephone Encounter (Signed)
Faxed handwritten rx for clonazepam

## 2023-07-03 ENCOUNTER — Telehealth: Payer: Self-pay | Admitting: Neurology

## 2023-07-03 NOTE — Telephone Encounter (Signed)
PHONE ROOM: Tell the pt that the provider just refilled it on 06/27/23 and that would be refill to soon. Also insurance will only cover 30 day supply at a time. If she were to get more she would have to pay out of pocket

## 2023-07-03 NOTE — Telephone Encounter (Signed)
Requested Prescriptions   Pending Prescriptions Disp Refills   clonazePAM (KLONOPIN) 0.5 MG tablet 60 tablet 0    Sig: Take 0.5 tablets (0.25 mg total) by mouth 2 (two) times daily as needed for anxiety.   Last seen 05/28/23 Next appt 11/26/23  Dispenses  Routing to provider as they are out of town for funeral  Dispensed Days Supply Quantity Provider Pharmacy  CLONAZEPAM 0.5 MG TABLET 06/06/2023 30 15 each Windell Norfolk, MD [id number]...  CLONAZEPAM 0.5 MG TABLET 05/07/2023 30 15 each Windell Norfolk, MD [id number]...  CLONAZEPAM 0.5 MG TABLET 04/06/2023 30 15 each Windell Norfolk, MD [id number]...  CLONAZEPAM 0.5 MG TABLET 03/09/2023 30 15 each Windell Norfolk, MD [id number]...  CLONAZEPAM 0.5 MG TABLET 02/05/2023 30 15 each Windell Norfolk, MD [id number]...  CLONAZEPAM 0.5 MG TABLET 01/08/2023 30 15 each Windell Norfolk, MD [id number]...  CLONAZEPAM 0.5 MG TABLET 12/07/2022 30 30 each Windell Norfolk, MD [id number]...  CLONAZEPAM 0.5 MG TABLET 11/07/2022 30 30 each Windell Norfolk, MD [id number]...  CLONAZEPAM 0.5 MG TABLET 10/06/2022 30 30 each Windell Norfolk, MD [id number]...  CLONAZEPAM 0.5 MG TABLET 09/05/2022 30 15 each Windell Norfolk, MD [facility] [phone] - J...  CLONAZEPAM 0.5 MG TABLET 08/07/2022 30 30 each Windell Norfolk, MD [facility] [phone] - J...  CLONAZEPAM 0.5 MG TABLET 07/10/2022 30 30 each Dohmeier, Porfirio Mylar, MD [facility] [phone] - J.Marland KitchenMarland Kitchen

## 2023-07-03 NOTE — Telephone Encounter (Addendum)
Pt requested refill for clonazePAM (KLONOPIN) 0.5 MG tablet to be sent to  [facility]  [address] [place], [place]. [address] [phone].   Pt is out of state due attending son's funeral.

## 2023-07-03 NOTE — Telephone Encounter (Signed)
I just refilled it on 9/25

## 2023-07-05 NOTE — Telephone Encounter (Signed)
Requested Prescriptions   Pending Prescriptions Disp Refills   clonazePAM (KLONOPIN) 0.5 MG tablet 60 tablet 0    Sig: Take 0.5 tablets (0.25 mg total) by mouth 2 (two) times daily as needed for anxiety.   Last seen 05/28/23 Next appt 11/26/23   Routing to provider as they are out of town for funeral  Dispensed Days Supply Quantity Provider Pharmacy  CLONAZEPAM 0.5 MG TABLET 06/06/2023 30 15 each Windell Norfolk, MD [id number]...  CLONAZEPAM 0.5 MG TABLET 05/07/2023 30 15 each Windell Norfolk, MD [id number]...  CLONAZEPAM 0.5 MG TABLET 04/06/2023 30 15 each Windell Norfolk, MD [id number]...  CLONAZEPAM 0.5 MG TABLET 03/09/2023 30 15 each Windell Norfolk, MD [id number]...  CLONAZEPAM 0.5 MG TABLET 02/05/2023 30 15 each Windell Norfolk, MD [id number]...  CLONAZEPAM 0.5 MG TABLET 01/08/2023 30 15 each Windell Norfolk, MD [id number]...  CLONAZEPAM 0.5 MG TABLET 12/07/2022 30 30 each Windell Norfolk, MD [id number]...  CLONAZEPAM 0.5 MG TABLET 11/07/2022 30 30 each Windell Norfolk, MD [id number]...  CLONAZEPAM 0.5 MG TABLET 10/06/2022 30 30 each Windell Norfolk, MD [id number]...  CLONAZEPAM 0.5 MG TABLET 09/05/2022 30 15 each Windell Norfolk, MD [facility] [phone] - J...  CLONAZEPAM 0.5 MG TABLET 08/07/2022 30 30 each Windell Norfolk, MD [facility] [phone] - J...  CLONAZEPAM 0.5 MG TABLET 07/10/2022 30 30 each Dohmeier, Porfirio Mylar, MD [facility]

## 2023-07-05 NOTE — Telephone Encounter (Signed)
Nurse calling requested pharmacy to get Fax number to resend refill

## 2023-07-09 MED ORDER — CLONAZEPAM 0.5 MG PO TABS: 0.2500 mg | ORAL_TABLET | Freq: Two times a day (BID) | ORAL | 0 refills | Status: DC | PRN

## 2023-08-06 ENCOUNTER — Encounter: Payer: Self-pay | Admitting: Neurology

## 2023-08-07 MED ORDER — CLONAZEPAM 0.5 MG PO TABS: 0.2500 mg | ORAL_TABLET | Freq: Two times a day (BID) | ORAL | 0 refills | Status: AC | PRN

## 2023-10-09 ENCOUNTER — Other Ambulatory Visit: Payer: Self-pay | Admitting: Neurology

## 2023-10-09 NOTE — Telephone Encounter (Signed)
 Requested Prescriptions   Pending Prescriptions Disp Refills   clonazePAM  (KLONOPIN ) 0.5 MG tablet [Pharmacy Med Name: CLONAZEPAM  0.5 MG TABLET] 30 tablet 1    Sig: TAKE 1/2 TABLET BY MOUTH TWICE A DAY AS NEEDED FOR ANXIETY   Last seen 05/28/23, next appt 11/25/22  Dispenses   Dispensed Days Supply Quantity Provider Pharmacy  CLONAZEPAM  0.5 MG TABLET 09/06/2023 30 15 each Camara, Amadou, [phone] ...  CLONAZEPAM  0.5 MG TABLET 08/06/2023 30 30 each Camara, Amadou, MD [facility] [phone] - [facility]...  CLONAZEPAM  0.5 MG TABLET 07/04/2023 30 30 each Camara, Amadou, MD [id number]...  CLONAZEPAM  0.5 MG TABLET 06/06/2023 30 15 each Camara, Amadou, MD [id number]...  CLONAZEPAM  0.5 MG TABLET 05/07/2023 30 15 each Camara, Amadou, MD [id number]...  CLONAZEPAM  0.5 MG TABLET 04/06/2023 30 15 each Camara, Amadou, MD [id number]...  CLONAZEPAM  0.5 MG TABLET 03/09/2023 30 15 each Camara, Amadou, MD [id number]...  CLONAZEPAM  0.5 MG TABLET 02/05/2023 30 15 each Camara, Amadou, MD [id number]...  CLONAZEPAM  0.5 MG TABLET 01/08/2023 30 15 each Camara, Amadou, MD [id number]...  CLONAZEPAM  0.5 MG TABLET 12/07/2022 30 30 each Camara, Amadou, MD [id number]...  CLONAZEPAM  0.5 MG TABLET 11/07/2022 30 30 each Camara, Amadou, MD [id number]

## 2023-11-26 ENCOUNTER — Ambulatory Visit: Payer: Managed Care, Other (non HMO) | Admitting: Neurology

## 2023-11-26 ENCOUNTER — Encounter: Payer: Self-pay | Admitting: Neurology

## 2024-07-28 ENCOUNTER — Other Ambulatory Visit: Payer: Self-pay | Admitting: Medical Genetics

## 2024-07-28 DIAGNOSIS — Z006 Encounter for examination for normal comparison and control in clinical research program: Secondary | ICD-10-CM
